# Patient Record
Sex: Male | Born: 1945 | Race: White | Hispanic: No | Smoking: Former smoker
Health system: Southern US, Community
[De-identification: ages and names within clinical notes are randomized; demographics above are authoritative.]

## PROBLEM LIST (undated history)

## (undated) DIAGNOSIS — I10 Essential (primary) hypertension: Secondary | ICD-10-CM

## (undated) DIAGNOSIS — E785 Hyperlipidemia, unspecified: Secondary | ICD-10-CM

## (undated) DIAGNOSIS — M353 Polymyalgia rheumatica: Secondary | ICD-10-CM

## (undated) DIAGNOSIS — M1710 Unilateral primary osteoarthritis, unspecified knee: Secondary | ICD-10-CM

## (undated) DIAGNOSIS — M25519 Pain in unspecified shoulder: Secondary | ICD-10-CM

## (undated) DIAGNOSIS — S2239XA Fracture of one rib, unspecified side, initial encounter for closed fracture: Secondary | ICD-10-CM

## (undated) DIAGNOSIS — M79609 Pain in unspecified limb: Secondary | ICD-10-CM

## (undated) DIAGNOSIS — M791 Myalgia, unspecified site: Secondary | ICD-10-CM

## (undated) DIAGNOSIS — M109 Gout, unspecified: Secondary | ICD-10-CM

## (undated) DIAGNOSIS — D72829 Elevated white blood cell count, unspecified: Secondary | ICD-10-CM

## (undated) DIAGNOSIS — M2550 Pain in unspecified joint: Secondary | ICD-10-CM

## (undated) DIAGNOSIS — K5730 Diverticulosis of large intestine without perforation or abscess without bleeding: Secondary | ICD-10-CM

## (undated) DIAGNOSIS — E119 Type 2 diabetes mellitus without complications: Secondary | ICD-10-CM

## (undated) DIAGNOSIS — M25539 Pain in unspecified wrist: Secondary | ICD-10-CM

## (undated) DIAGNOSIS — I2510 Atherosclerotic heart disease of native coronary artery without angina pectoris: Secondary | ICD-10-CM

## (undated) DIAGNOSIS — Z86010 Personal history of colonic polyps: Secondary | ICD-10-CM

## (undated) DIAGNOSIS — H269 Unspecified cataract: Secondary | ICD-10-CM

## (undated) DIAGNOSIS — F528 Other sexual dysfunction not due to a substance or known physiological condition: Secondary | ICD-10-CM

## (undated) DIAGNOSIS — K219 Gastro-esophageal reflux disease without esophagitis: Secondary | ICD-10-CM

## (undated) HISTORY — DX: Gastro-esophageal reflux disease without esophagitis: K21.9

## (undated) HISTORY — PX: OTHER SURGICAL HISTORY: SHX169

## (undated) HISTORY — DX: Pain in unspecified wrist: M25.539

## (undated) HISTORY — PX: COLONOSCOPY: SHX174

## (undated) HISTORY — DX: Hyperlipidemia, unspecified: E78.5

## (undated) HISTORY — PX: UPPER GASTROINTESTINAL ENDOSCOPY: SHX188

## (undated) HISTORY — DX: Pain in unspecified shoulder: M25.519

## (undated) HISTORY — DX: Personal history of colonic polyps: Z86.010

## (undated) HISTORY — DX: Other sexual dysfunction not due to a substance or known physiological condition: F52.8

## (undated) HISTORY — DX: Fracture of one rib, unspecified side, initial encounter for closed fracture: S22.39XA

## (undated) HISTORY — DX: Diverticulosis of large intestine without perforation or abscess without bleeding: K57.30

## (undated) HISTORY — PX: HYDROCELE EXCISION / REPAIR: SUR1145

## (undated) HISTORY — DX: Elevated white blood cell count, unspecified: D72.829

## (undated) HISTORY — DX: Gout, unspecified: M10.9

## (undated) HISTORY — DX: Unilateral primary osteoarthritis, unspecified knee: M17.10

## (undated) HISTORY — DX: Myalgia, unspecified site: M79.10

## (undated) HISTORY — DX: Unspecified cataract: H26.9

## (undated) HISTORY — PX: KNEE CARTILAGE SURGERY: SHX688

## (undated) HISTORY — DX: Pain in unspecified joint: M25.50

## (undated) HISTORY — PX: SHOULDER ARTHROSCOPY: SHX128

## (undated) HISTORY — DX: Pain in unspecified limb: M79.609

## (undated) HISTORY — PX: CATARACT EXTRACTION: SUR2

## (undated) HISTORY — DX: Polymyalgia rheumatica: M35.3

## (undated) HISTORY — DX: Atherosclerotic heart disease of native coronary artery without angina pectoris: I25.10

## (undated) HISTORY — DX: Type 2 diabetes mellitus without complications: E11.9

## (undated) HISTORY — DX: Essential (primary) hypertension: I10

---

## 2003-01-30 ENCOUNTER — Encounter: Admission: RE | Admit: 2003-01-30 | Discharge: 2003-04-30 | Payer: Self-pay | Admitting: Internal Medicine

## 2003-03-16 ENCOUNTER — Ambulatory Visit (HOSPITAL_COMMUNITY): Admission: RE | Admit: 2003-03-16 | Discharge: 2003-03-16 | Payer: Self-pay | Admitting: Internal Medicine

## 2003-03-16 ENCOUNTER — Encounter (INDEPENDENT_AMBULATORY_CARE_PROVIDER_SITE_OTHER): Payer: Self-pay | Admitting: Specialist

## 2004-05-08 ENCOUNTER — Encounter (INDEPENDENT_AMBULATORY_CARE_PROVIDER_SITE_OTHER): Payer: Self-pay | Admitting: Specialist

## 2004-05-08 ENCOUNTER — Ambulatory Visit (HOSPITAL_COMMUNITY): Admission: RE | Admit: 2004-05-08 | Discharge: 2004-05-08 | Payer: Self-pay | Admitting: General Surgery

## 2004-08-11 DIAGNOSIS — S2249XA Multiple fractures of ribs, unspecified side, initial encounter for closed fracture: Secondary | ICD-10-CM

## 2004-08-11 HISTORY — DX: Multiple fractures of ribs, unspecified side, initial encounter for closed fracture: S22.49XA

## 2005-01-09 ENCOUNTER — Ambulatory Visit: Payer: Self-pay | Admitting: Internal Medicine

## 2005-01-16 ENCOUNTER — Ambulatory Visit: Payer: Self-pay | Admitting: Internal Medicine

## 2006-06-03 ENCOUNTER — Ambulatory Visit: Payer: Self-pay | Admitting: Internal Medicine

## 2006-06-03 LAB — CONVERTED CEMR LAB
ALT: 36 units/L (ref 0–40)
AST: 35 units/L (ref 0–37)
Albumin: 3.9 g/dL (ref 3.5–5.2)
Alkaline Phosphatase: 68 units/L (ref 39–117)
BUN: 14 mg/dL (ref 6–23)
Basophils Absolute: 0 10*3/uL (ref 0.0–0.1)
Basophils Relative: 0.4 % (ref 0.0–1.0)
Bilirubin Urine: NEGATIVE
CO2: 28 meq/L (ref 19–32)
Calcium: 9.2 mg/dL (ref 8.4–10.5)
Chloride: 107 meq/L (ref 96–112)
Chol/HDL Ratio, serum: 5.3
Cholesterol: 198 mg/dL (ref 0–200)
Creatinine, Ser: 1 mg/dL (ref 0.4–1.5)
Eosinophil percent: 1.2 % (ref 0.0–5.0)
GFR calc non Af Amer: 81 mL/min
Glomerular Filtration Rate, Af Am: 98 mL/min/{1.73_m2}
Glucose, Bld: 117 mg/dL — ABNORMAL HIGH (ref 70–99)
HCT: 43.9 % (ref 39.0–52.0)
HDL: 37.7 mg/dL — ABNORMAL LOW (ref >39.0)
Hemoglobin, Urine: NEGATIVE
Hemoglobin: 14.7 g/dL (ref 13.0–17.0)
Hgb A1c MFr Bld: 5.8 % (ref 4.6–6.0)
Ketones, ur: NEGATIVE mg/dL
LDL Cholesterol: 135 mg/dL — ABNORMAL HIGH (ref 0–99)
Leukocytes, UA: NEGATIVE
Lymphocytes Relative: 32.9 % (ref 12.0–46.0)
MCHC: 33.6 g/dL (ref 30.0–36.0)
MCV: 91.9 fL (ref 78.0–100.0)
Monocytes Absolute: 0.4 10*3/uL (ref 0.2–0.7)
Monocytes Relative: 5.8 % (ref 3.0–11.0)
Neutro Abs: 4.5 10*3/uL (ref 1.4–7.7)
Neutrophils Relative %: 59.7 % (ref 43.0–77.0)
Nitrite: NEGATIVE
PSA: 1.35 ng/mL (ref 0.10–4.00)
Platelets: 228 10*3/uL (ref 150–400)
Potassium: 4.5 meq/L (ref 3.5–5.1)
RBC: 4.77 M/uL (ref 4.22–5.81)
RDW: 12.8 % (ref 11.5–14.6)
Sodium: 141 meq/L (ref 135–145)
Specific Gravity, Urine: 1.025 (ref 1.000–1.03)
TSH: 0.85 microintl units/mL (ref 0.35–5.50)
Total Bilirubin: 0.8 mg/dL (ref 0.3–1.2)
Total Protein, Urine: NEGATIVE mg/dL
Total Protein: 7.2 g/dL (ref 6.0–8.3)
Triglyceride fasting, serum: 129 mg/dL (ref 0–149)
Urine Glucose: NEGATIVE mg/dL
Urobilinogen, UA: 0.2 (ref 0.0–1.0)
VLDL: 26 mg/dL (ref 0–40)
WBC: 7.4 10*3/uL (ref 4.5–10.5)
pH: 5.5 (ref 5.0–8.0)

## 2006-06-10 ENCOUNTER — Ambulatory Visit: Payer: Self-pay | Admitting: Internal Medicine

## 2007-06-08 ENCOUNTER — Encounter: Payer: Self-pay | Admitting: *Deleted

## 2007-06-08 DIAGNOSIS — H269 Unspecified cataract: Secondary | ICD-10-CM | POA: Insufficient documentation

## 2007-06-08 HISTORY — DX: Unspecified cataract: H26.9

## 2007-06-23 ENCOUNTER — Ambulatory Visit: Payer: Self-pay | Admitting: Internal Medicine

## 2007-06-25 LAB — CONVERTED CEMR LAB
AST: 23 units/L (ref 0–37)
Alkaline Phosphatase: 68 units/L (ref 39–117)
Bilirubin, Direct: 0.2 mg/dL (ref 0.0–0.3)
CO2: 30 meq/L (ref 19–32)
Calcium: 9.2 mg/dL (ref 8.4–10.5)
Chloride: 105 meq/L (ref 96–112)
Creatinine, Ser: 0.8 mg/dL (ref 0.4–1.5)
Eosinophils Absolute: 0.1 10*3/uL (ref 0.0–0.6)
Eosinophils Relative: 0.8 % (ref 0.0–5.0)
Glucose, Bld: 116 mg/dL — ABNORMAL HIGH (ref 70–99)
HDL: 38.8 mg/dL — ABNORMAL LOW (ref >39.0)
Hemoglobin: 14.6 g/dL (ref 13.0–17.0)
LDL Cholesterol: 112 mg/dL — ABNORMAL HIGH (ref 0–99)
Leukocytes, UA: NEGATIVE
Lymphocytes Relative: 21.5 % (ref 12.0–46.0)
MCHC: 35.6 g/dL (ref 30.0–36.0)
MCV: 92.7 fL (ref 78.0–100.0)
Neutro Abs: 5.8 10*3/uL (ref 1.4–7.7)
Platelets: 247 10*3/uL (ref 150–400)
Potassium: 4.9 meq/L (ref 3.5–5.1)
RDW: 13.2 % (ref 11.5–14.6)
Sodium: 141 meq/L (ref 135–145)
Triglycerides: 170 mg/dL — ABNORMAL HIGH (ref 0–149)
Urobilinogen, UA: 0.2 (ref 0.0–1.0)

## 2007-06-30 ENCOUNTER — Ambulatory Visit: Payer: Self-pay | Admitting: Internal Medicine

## 2007-06-30 DIAGNOSIS — F528 Other sexual dysfunction not due to a substance or known physiological condition: Secondary | ICD-10-CM | POA: Insufficient documentation

## 2007-06-30 DIAGNOSIS — K219 Gastro-esophageal reflux disease without esophagitis: Secondary | ICD-10-CM | POA: Insufficient documentation

## 2007-06-30 DIAGNOSIS — E785 Hyperlipidemia, unspecified: Secondary | ICD-10-CM

## 2007-06-30 DIAGNOSIS — IMO0002 Reserved for concepts with insufficient information to code with codable children: Secondary | ICD-10-CM

## 2007-06-30 DIAGNOSIS — M1710 Unilateral primary osteoarthritis, unspecified knee: Secondary | ICD-10-CM

## 2007-06-30 HISTORY — DX: Hyperlipidemia, unspecified: E78.5

## 2007-06-30 HISTORY — DX: Gastro-esophageal reflux disease without esophagitis: K21.9

## 2007-06-30 HISTORY — DX: Reserved for concepts with insufficient information to code with codable children: IMO0002

## 2007-06-30 HISTORY — DX: Other sexual dysfunction not due to a substance or known physiological condition: F52.8

## 2008-08-09 ENCOUNTER — Ambulatory Visit: Payer: Self-pay | Admitting: Internal Medicine

## 2008-08-09 LAB — CONVERTED CEMR LAB
Albumin: 3.9 g/dL (ref 3.5–5.2)
Alkaline Phosphatase: 66 units/L (ref 39–117)
Basophils Absolute: 0.1 10*3/uL (ref 0.0–0.1)
Bilirubin, Direct: 0.1 mg/dL (ref 0.0–0.3)
Chloride: 102 meq/L (ref 96–112)
Cholesterol: 246 mg/dL (ref 0–200)
Eosinophils Absolute: 0.1 10*3/uL (ref 0.0–0.7)
Eosinophils Relative: 1.3 % (ref 0.0–5.0)
GFR calc Af Amer: 110 mL/min
HDL: 49.2 mg/dL (ref >39.0)
Hemoglobin, Urine: NEGATIVE
Leukocytes, UA: NEGATIVE
Monocytes Absolute: 0.5 10*3/uL (ref 0.1–1.0)
Neutro Abs: 5.3 10*3/uL (ref 1.4–7.7)
Neutrophils Relative %: 68.6 % (ref 43.0–77.0)
PSA: 1.98 ng/mL (ref 0.10–4.00)
RBC: 4.52 M/uL (ref 4.22–5.81)
Specific Gravity, Urine: 1.01 (ref 1.000–1.03)
Total CHOL/HDL Ratio: 5
Triglycerides: 276 mg/dL (ref 0–149)
Urine Glucose: NEGATIVE mg/dL
Urobilinogen, UA: 0.2 (ref 0.0–1.0)
VLDL: 55 mg/dL — ABNORMAL HIGH (ref 0–40)
pH: 6.5 (ref 5.0–8.0)

## 2008-08-17 ENCOUNTER — Ambulatory Visit: Payer: Self-pay | Admitting: Internal Medicine

## 2008-08-17 DIAGNOSIS — Z860100 Personal history of colon polyps, unspecified: Secondary | ICD-10-CM

## 2008-08-17 DIAGNOSIS — I10 Essential (primary) hypertension: Secondary | ICD-10-CM | POA: Insufficient documentation

## 2008-08-17 DIAGNOSIS — K5730 Diverticulosis of large intestine without perforation or abscess without bleeding: Secondary | ICD-10-CM

## 2008-08-17 HISTORY — DX: Essential (primary) hypertension: I10

## 2008-08-17 HISTORY — DX: Diverticulosis of large intestine without perforation or abscess without bleeding: K57.30

## 2008-08-17 HISTORY — DX: Personal history of colonic polyps: Z86.010

## 2008-08-17 HISTORY — DX: Personal history of colon polyps, unspecified: Z86.0100

## 2008-12-12 ENCOUNTER — Encounter (INDEPENDENT_AMBULATORY_CARE_PROVIDER_SITE_OTHER): Payer: Self-pay | Admitting: *Deleted

## 2009-02-07 ENCOUNTER — Ambulatory Visit: Payer: Self-pay | Admitting: Internal Medicine

## 2009-02-07 LAB — CONVERTED CEMR LAB
CO2: 28 meq/L (ref 19–32)
Calcium: 9.3 mg/dL (ref 8.4–10.5)
Cholesterol: 208 mg/dL — ABNORMAL HIGH (ref 0–200)
Creatinine, Ser: 0.9 mg/dL (ref 0.4–1.5)
Hgb A1c MFr Bld: 6.8 % — ABNORMAL HIGH (ref 4.6–6.5)
Potassium: 4.4 meq/L (ref 3.5–5.1)
Triglycerides: 355 mg/dL — ABNORMAL HIGH (ref 0.0–149.0)

## 2009-02-14 ENCOUNTER — Ambulatory Visit: Payer: Self-pay | Admitting: Internal Medicine

## 2009-10-03 ENCOUNTER — Ambulatory Visit: Payer: Self-pay | Admitting: Internal Medicine

## 2009-10-03 LAB — CONVERTED CEMR LAB
Alkaline Phosphatase: 73 units/L (ref 39–117)
Basophils Absolute: 0 10*3/uL (ref 0.0–0.1)
Basophils Relative: 0 % (ref 0.0–3.0)
Bilirubin, Direct: 0.1 mg/dL (ref 0.0–0.3)
CO2: 31 meq/L (ref 19–32)
Cholesterol: 210 mg/dL — ABNORMAL HIGH (ref 0–200)
Eosinophils Absolute: 0.2 10*3/uL (ref 0.0–0.7)
Eosinophils Relative: 2.5 % (ref 0.0–5.0)
HDL: 50.3 mg/dL (ref >39.00)
Hemoglobin: 14.1 g/dL (ref 13.0–17.0)
Hgb A1c MFr Bld: 7.5 % — ABNORMAL HIGH (ref 4.6–6.5)
Ketones, ur: NEGATIVE mg/dL
Lymphs Abs: 1.6 10*3/uL (ref 0.7–4.0)
MCV: 93.4 fL (ref 78.0–100.0)
Monocytes Absolute: 0.4 10*3/uL (ref 0.1–1.0)
Monocytes Relative: 6.4 % (ref 3.0–12.0)
Neutro Abs: 4.2 10*3/uL (ref 1.4–7.7)
PSA: 2.52 ng/mL (ref 0.10–4.00)
Specific Gravity, Urine: 1.015 (ref 1.000–1.030)
TSH: 1.11 microintl units/mL (ref 0.35–5.50)
Total CHOL/HDL Ratio: 4
Total Protein: 7.1 g/dL (ref 6.0–8.3)
pH: 6 (ref 5.0–8.0)

## 2009-10-10 ENCOUNTER — Ambulatory Visit: Payer: Self-pay | Admitting: Internal Medicine

## 2009-10-10 DIAGNOSIS — E119 Type 2 diabetes mellitus without complications: Secondary | ICD-10-CM | POA: Insufficient documentation

## 2009-10-10 HISTORY — DX: Type 2 diabetes mellitus without complications: E11.9

## 2010-04-22 ENCOUNTER — Ambulatory Visit: Payer: Self-pay | Admitting: Internal Medicine

## 2010-04-22 DIAGNOSIS — M79609 Pain in unspecified limb: Secondary | ICD-10-CM

## 2010-04-22 HISTORY — DX: Pain in unspecified limb: M79.609

## 2010-07-09 ENCOUNTER — Ambulatory Visit: Payer: Self-pay | Admitting: Internal Medicine

## 2010-07-09 DIAGNOSIS — M25539 Pain in unspecified wrist: Secondary | ICD-10-CM

## 2010-07-09 DIAGNOSIS — M109 Gout, unspecified: Secondary | ICD-10-CM | POA: Insufficient documentation

## 2010-07-09 DIAGNOSIS — M25519 Pain in unspecified shoulder: Secondary | ICD-10-CM

## 2010-07-09 HISTORY — DX: Pain in unspecified shoulder: M25.519

## 2010-07-09 HISTORY — DX: Pain in unspecified wrist: M25.539

## 2010-07-09 HISTORY — DX: Gout, unspecified: M10.9

## 2010-09-10 NOTE — Assessment & Plan Note (Signed)
Summary: WRIST PAIN/CD   Vital Signs:  Patient profile:   65 year old male Height:      66 inches Weight:      194.50 pounds BMI:     31.51 O2 Sat:      95 % on Room air Temp:     98.3 degrees F oral Pulse rate:   79 / minute BP sitting:   142 / 82  (left arm) Cuff size:   regular  Vitals Entered By: Zella Ball Ewing CMA Duncan Dull) (April 22, 2010 2:23 PM)  O2 Flow:  Room air  CC: Right wrist pain/RE   CC:  Right wrist pain/RE.  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  Trying to follow lower chol diet, but not always able to control his diet as he would like.    Problems Prior to Update: 1)  Hand Pain, Right  (ICD-729.5) 2)  Diabetes Mellitus, Type II  (ICD-250.00) 3)  Preventive Health Care  (ICD-V70.0) 4)  Colonic Polyps, Hx of  (ICD-V12.72) 5)  Diverticulosis, Colon  (ICD-562.10) 6)  Hypertension  (ICD-401.9) 7)  Family History Diabetes 1st Degree Relative  (ICD-V18.0) 8)  Gerd  (ICD-530.81) 9)  Osteoarthritis, Knee, Left  (ICD-715.96) 10)  Erectile Dysfunction  (ICD-302.72) 11)  Hyperlipidemia  (ICD-272.4) 12)  Preventive Health Care  (ICD-V70.0) 13)  Special Screening Malignant Neoplasm of Prostate  (ICD-V76.44) 14)  Routine General Medical Exam@health  Care Facl  (ICD-V70.0) 15)  Cataract Nos   (ICD-366.9)  Medications Prior to Update: 1)  Omeprazole 20 Mg  Cpdr (Omeprazole) .Marland Kitchen.. 1 By Mouth Once Daily 2)  Cialis 20 Mg  Tabs (Tadalafil) .Marland Kitchen.. 1 By Mouth Once Daily Prn 3)  Adult Aspirin Ec Low Strength 81 Mg Tbec (Aspirin) .Marland Kitchen.. 1po Once Daily 4)  Metformin Hcl 500 Mg Xr24h-Tab (Metformin Hcl) .Marland Kitchen.. 1po Once Daily  Current Medications (verified): 1)  Omeprazole 20 Mg  Cpdr (Omeprazole) .Marland Kitchen.. 1 By Mouth Once Daily 2)  Cialis 20 Mg  Tabs (Tadalafil) .Marland Kitchen.. 1 By Mouth Once Daily Prn 3)  Adult Aspirin Ec Low Strength 81 Mg Tbec (Aspirin) .Marland Kitchen.. 1po Once Daily 4)  Metformin Hcl 500 Mg Xr24h-Tab (Metformin Hcl) .Marland Kitchen.. 1po Once Daily 5)  Naproxen 500 Mg Tabs (Naproxen) .Marland Kitchen.. 1po Two Times A Day As Needed  Allergies (verified): No Known Drug Allergies  Past History:  Past Medical History: Last updated: 10/10/2009 Hyperlipidemia E.D. 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   Head:  normocephalic and atraumatic.   Eyes:  vision grossly intact, pupils equal, and pupils round.   Ears:  R ear normal and L ear normal.   Nose:  no external deformity and no nasal discharge.   Mouth:  no gingival abnormalities and pharynx pink and moist.   Neck:  supple and no masses.   Lungs:  normal respiratory effort and normal breath sounds.   Heart:  normal rate and regular rhythm.   Msk:  has multiple bony joint changes to most fingers of both  hands , right more than left, , without active synovitis Extremities:  no edema, no erythema  Neurologic:  cranial nerves II-XII intact, strength normal in all extremities, sensation intact to light touch, and DTRs symmetrical and normal.  including normal bilat grip strength   Impression & Recommendations:  Problem # 1:  HAND PAIN, RIGHT (ICD-729.5)  exam c/w  djd fingers and wrist;  and ? CTS as well - for naproxen as needed, and right wrist splint at night only  Orders: Ankle / Wrist Splint (A4570)  Problem # 2:  HYPERTENSION (ICD-401.9)  BP today: 142/82 Prior BP: 130/84 (10/10/2009)  Labs Reviewed: K+: 4.5 (10/03/2009) Creat: : 0.8 (10/03/2009)   Chol: 210 (10/03/2009)   HDL: 50.30 (10/03/2009)   LDL: DEL (08/09/2008)   TG: 295.0 (10/03/2009) mild elev today, likely situational, ok to follow, continue same treatment   Problem # 3:  HYPERLIPIDEMIA (ICD-272.4)  Labs Reviewed: SGOT: 33 (10/03/2009)   SGPT: 43 (10/03/2009)   HDL:50.30 (10/03/2009), 42.00 (02/07/2009)  LDL:DEL (08/09/2008), 112 (06/23/2007)  Chol:210 (10/03/2009), 208 (02/07/2009)  Trig:295.0 (10/03/2009), 355.0 (02/07/2009) d/w pt - cont to follow diet, no other tx at this time  Complete Medication List: 1)  Omeprazole 20 Mg Cpdr (Omeprazole) .Marland Kitchen.. 1 by mouth once daily 2)  Cialis 20 Mg Tabs (Tadalafil) .Marland Kitchen.. 1 by mouth once daily prn 3)  Adult Aspirin Ec Low Strength 81 Mg Tbec (Aspirin) .Marland Kitchen.. 1po once daily 4)  Metformin Hcl 500 Mg Xr24h-tab (Metformin hcl) .Marland Kitchen.. 1po once daily 5)  Naproxen 500 Mg Tabs (Naproxen) .Marland Kitchen.. 1po two times a day as needed  Patient Instructions: 1)  Please take all new medications as prescribed 2)  Continue all previous medications as before this visit  3)  you are given the right wrist splint to wear at night only 4)  followup with any worsening symptoms 5)  Please schedule a follow-up appointment in Feb 2012 with CPX labs and; 6)  HbgA1C prior to visit, ICD-9:  250.02 Prescriptions: NAPROXEN 500 MG TABS (NAPROXEN) 1po two times a day as needed  #60 x 5   Entered and Authorized by:   Corwin Levins MD   Signed by:   Corwin Levins MD on 04/22/2010   Method used:   Electronically to        [facility] [phone]* (retail)       [address] [place], [place]  [address]       Ph: [phone]       Fax: [phone]   RxID:   [id number]

## 2010-09-10 NOTE — Assessment & Plan Note (Signed)
Summary: CPX / NWS #   Vital Signs:  Patient profile:   65 year old male Height:      66 inches Weight:      196.25 pounds BMI:     31.79 O2 Sat:      96 % on Room air Temp:     98.1 degrees F oral Pulse rate:   80 / minute BP sitting:   130 / 84  (left arm) Cuff size:   large  Vitals Entered ByZella Ball Ewing (October 10, 2009 1:17 PM)  O2 Flow:  Room air  CC: Adult Physical/RE   CC:  Adult Physical/RE.  History of Present Illness: gained 4 lbs since last yr, has been trying to avoid splenda and diet colas with more regular sodas and real sugar in the coffee, no wt low;  still cannot afford the copay for the colonscopy f/u and the logistics - does not have much help at this point; Pt denies CP, sob, doe, wheezing, orthopnea, pnd, worsening LE edema, palps, dizziness or syncope  Pt denies new neuro symptoms such as headache, facial or extremity weakness Pt denies polydipsia, polyuria, or low sugar symptoms such as shakiness improved with eating.  Overall good compliance with meds, trying to follow low chol, DM diet, wt stable, little excercise however   Problems Prior to Update: 1)  Diabetes Mellitus, Type II  (ICD-250.00) 2)  Preventive Health Care  (ICD-V70.0) 3)  Colonic Polyps, Hx of  (ICD-V12.72) 4)  Diverticulosis, Colon  (ICD-562.10) 5)  Hypertension  (ICD-401.9) 6)  Family History Diabetes 1st Degree Relative  (ICD-V18.0) 7)  Gerd  (ICD-530.81) 8)  Osteoarthritis, Knee, Left  (ICD-715.96) 9)  Erectile Dysfunction  (ICD-302.72) 10)  Hyperlipidemia  (ICD-272.4) 11)  Preventive Health Care  (ICD-V70.0) 12)  Special Screening Malignant Neoplasm of Prostate  (ICD-V76.44) 13)  Routine General Medical Exam@health  Care Facl  (ICD-V70.0) 14)  Cataract Nos  (ICD-366.9)  Medications Prior to Update: 1)  Omeprazole 20 Mg  Cpdr (Omeprazole) .Marland Kitchen.. 1 By Mouth Once Daily 2)  Meloxicam 15 Mg  Tabs (Meloxicam) .Marland Kitchen.. 1 By Mouth  Once Daily As Needed 3)  Cialis 20 Mg  Tabs (Tadalafil)  .Marland Kitchen.. 1 By Mouth Once Daily Prn 4)  Adult Aspirin Ec Low Strength 81 Mg Tbec (Aspirin) .Marland Kitchen.. 1po Once Daily 5)  Cozaar 50 Mg Tabs (Losartan Potassium) .... Take 1 Tab By Mouth Every Day  Current Medications (verified): 1)  Omeprazole 20 Mg  Cpdr (Omeprazole) .Marland Kitchen.. 1 By Mouth Once Daily 2)  Cialis 20 Mg  Tabs (Tadalafil) .Marland Kitchen.. 1 By Mouth Once Daily Prn 3)  Adult Aspirin Ec Low Strength 81 Mg Tbec (Aspirin) .Marland Kitchen.. 1po Once Daily 4)  Metformin Hcl 500 Mg Xr24h-Tab (Metformin Hcl) .Marland Kitchen.. 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 left knee djd GERD Hypertension bilateral cataracts Diverticulosis, colon Colonic polyps, hx of esophageal stricture Diabetes mellitus, type II  Past Surgical History: Reviewed history from 08/17/2008 and no changes required. Cataract extraction Inguinal herniorrhaphy s/p right knee arthroscopic surgury in his 25's  Family History: Reviewed history from 08/17/2008 and no changes required. Family History Diabetes 1st degree relative Dementia  in both parents aunt with parkinsons  Social History: Reviewed history from 08/17/2008 and no changes required. 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         all otherwise negative per pt -   Physical Exam  General:  alert and overweight-appearing.   Head:  normocephalic and atraumatic.   Eyes:  vision grossly intact, pupils equal, and pupils round.   Ears:  R ear normal and L ear normal.   Nose:  no external deformity and no nasal discharge.   Mouth:  no gingival abnormalities and pharynx pink and moist.   Neck:  supple and no masses.   Lungs:  normal respiratory effort and normal breath sounds.   Heart:  normal rate and regular rhythm.   Abdomen:  soft, non-tender, and normal bowel sounds.   Msk:  no joint tenderness and no joint swelling.   Extremities:  no edema, no erythema  Neurologic:  cranial nerves II-XII intact and strength normal in all extremities.     Impression & Recommendations:  Problem # 1:  Preventive Health Care (ICD-V70.0)  Overall doing well, age appropriate education and counseling updated and referral for appropriate preventive services done unless declined, immunizations up to date or declined, diet counseling done if overweight, urged to quit smoking if smokes , most recent labs reviewed and current ordered if appropriate, ecg reviewed or declined (interpretation per ECG scanned in the EMR if done); information regarding Medicare Prevention requirements given if appropriate   Orders: EKG w/ Interpretation (93000)  Problem # 2:  DIABETES MELLITUS, TYPE II (ICD-250.00)  The following medications were removed from the medication list:    Cozaar 50 Mg Tabs (Losartan potassium) .Marland Kitchen... Take 1 tab by mouth every day His updated medication list for this problem includes:    Adult Aspirin Ec Low Strength 81 Mg Tbec (Aspirin) .Marland Kitchen... 1po once daily    Metformin Hcl 500 Mg Xr24h-tab (Metformin hcl) .Marland Kitchen... 1po once daily new onset - to start metformin asd, f/u  next visit, urged diet complaicne (has had DM diet education)   Labs Reviewed: Creat: 0.8 (10/03/2009)    Reviewed HgBA1c results: 7.5 (10/03/2009)  6.8 (02/07/2009)  Problem # 3:  HYPERTENSION (ICD-401.9)  The following medications were removed from the medication list:    Cozaar 50 Mg Tabs (Losartan potassium) .Marland Kitchen... Take 1 tab by mouth every day declines meds for now; improved with low salt diet  BP today: 130/84 Prior BP: 148/102 (02/14/2009)  Labs Reviewed: K+: 4.5 (10/03/2009) Creat: : 0.8 (10/03/2009)   Chol: 210 (10/03/2009)   HDL: 50.30 (10/03/2009)   LDL: DEL (08/09/2008)   TG: 295.0 (10/03/2009)  Problem # 4:  HYPERLIPIDEMIA (ICD-272.4)  Labs Reviewed: SGOT: 33 (10/03/2009)   SGPT: 43 (10/03/2009)   HDL:50.30 (10/03/2009), 42.00 (02/07/2009)  LDL:DEL (08/09/2008), 112 (06/23/2007)  Chol:210 (10/03/2009), 208 (02/07/2009)  Trig:295.0 (10/03/2009), 355.0 (02/07/2009)  d/w pt - goal ldl less than 70 - declines statin at this time  Complete Medication List: 1)  Omeprazole 20 Mg Cpdr (Omeprazole) .Marland Kitchen.. 1 by mouth once daily 2)  Cialis 20 Mg Tabs (Tadalafil) .Marland Kitchen.. 1 by mouth  once daily prn 3)  Adult Aspirin Ec Low Strength 81 Mg Tbec (Aspirin) .Marland Kitchen.. 1po once daily 4)  Metformin Hcl 500 Mg Xr24h-tab (Metformin hcl) .Marland Kitchen.. 1po once daily  Patient Instructions: 1)  start the metformin 500 mg per day 2)  Continue all previous medications as before this visit  3)  All of your medications were sent to the pharmacy 4)  Take an Aspirin every day - 81 mg - 1 per day - COATED only 5)  Please schedule a follow-up appointment in 6 months with : 6)  BMP prior to visit, ICD-9: 250.02 7)  Lipid Panel prior to visit, ICD-9: 8)  HbgA1C prior to visit, ICD-9: 9)  Please call if you wish to be scheduled for the colonoscopy Prescriptions: OMEPRAZOLE 20 MG  CPDR (OMEPRAZOLE) 1 by mouth once daily  #90 x 3   Entered and Authorized by:   Corwin Levins MD   Signed by:   Corwin Levins MD on  10/10/2009   Method used:   Electronically to        [facility]  [address] [phone]* (retail)       [address]       [place], [place]  [address]       Ph: [phone] or [phone]       Fax: [phone]   RxID:   [id number] METFORMIN HCL 500 MG XR24H-TAB (METFORMIN HCL) 1po once daily  #90 x 3   Entered and Authorized by:   Corwin Levins MD   Signed by:   Corwin Levins MD on 10/10/2009   Method used:   Electronically to        [facility]  [address] [phone]* (retail)       [address]       [place], [place]  [address]       Ph: [phone] or [phone]       Fax: [phone]   RxID:   [id number]    Immunization History:  Influenza Immunization History:    Influenza:  historical (05/11/2009)    Immunization History:  Influenza Immunization History:    Influenza:  Historical (05/11/2009)

## 2010-09-10 NOTE — Assessment & Plan Note (Signed)
Summary: both hands,shoulders painful/?cd   Vital Signs:  Patient profile:   65 year old male Height:      66 inches Weight:      184.25 pounds BMI:     29.85 O2 Sat:      97 % on Room air Temp:     97.8 degrees F oral Pulse rate:   75 / minute BP sitting:   128 / 80  (left arm) Cuff size:   regular  Vitals Entered By: Zella Ball Ewing CMA Duncan Dull) (July 09, 2010 8:48 AM)  O2 Flow:  Room air CC: Shoulder and hand pain/RE   CC:  Shoulder and hand pain/RE.  History of Present Illness: here to f/u - was here last visit sept with ? tendonitis/cts/DJD to distal RUE , but now with visible and worsening swelling and pain to the right wrist and hand despite better diet with less red meat and no ETOH for 3 mo ;  alleve definitley seems to help the pain, better at night so at least he can get some sleep;  no trauma , injury, fever or hx of known gout; no known elev uric acid in the past.   No other joint problem except for pain to the right shoulder as well (no pain from shoulder to the wrist though - separate problem.  Also some vagie pains to the left hand having to compensate for decreased use of the right.  Bilat shoulder pain is ongoing for 4 to 6 wks, worse to abduct, again wtih without injruy, trauma, fever.  Does go to the gym doing the elliptical and treadmill only.  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   Pt denies CP, worsening sob, doe, wheezing, orthopnea, pnd, worsening LE edema, palps, dizziness or syncope  Pt denies other new neuro symptoms such as headache, facial or extremity weakness Pt denies polydipsia, polyuria, or low sugar symptoms such as shakiness improved with eating.  Overall good compliance with meds, trying to follow low chol, DM diet, wt stable, little excercise however   Preventive Screening-Counseling & Management      Drug Use:  no.    Problems Prior to Update: 1)  Gout   (ICD-274.9) 2)  Wrist Pain, Right  (ICD-719.43) 3)  Hand Pain, Right  (ICD-729.5) 4)  Diabetes Mellitus, Type II  (ICD-250.00) 5)  Preventive Health Care  (ICD-V70.0) 6)  Colonic Polyps, Hx of  (ICD-V12.72) 7)  Diverticulosis, Colon  (ICD-562.10) 8)  Hypertension  (ICD-401.9) 9)  Family History Diabetes 1st Degree Relative  (ICD-V18.0) 10)  Gerd  (ICD-530.81) 11)  Osteoarthritis, Knee, Left  (ICD-715.96) 12)  Erectile Dysfunction  (ICD-302.72) 13)  Hyperlipidemia  (ICD-272.4) 14)  Preventive Health Care  (ICD-V70.0) 15)  Special Screening Malignant Neoplasm of Prostate  (ICD-V76.44) 16)  Routine General Medical Exam@health  Care [facility]  (ICD-V70.0) 17)  Cataract Nos  (ICD-366.9)  Medications Prior to Update: 1)  Omeprazole 20 Mg  Cpdr (Omeprazole) .Marland Kitchen.. 1 By Mouth Once Daily 2)  Cialis 20 Mg  Tabs (Tadalafil) .Marland Kitchen.. 1 By Mouth Once Daily Prn 3)  Adult Aspirin Ec Low Strength 81 Mg Tbec (Aspirin) .Marland Kitchen.. 1po Once Daily 4)  Metformin Hcl 500 Mg Xr24h-Tab (Metformin Hcl) .Marland Kitchen.. 1po Once Daily 5)  Naproxen 500 Mg Tabs (Naproxen) .Marland Kitchen.. 1po Two Times A Day As Needed  Current Medications (verified): 1)  Omeprazole 20 Mg  Cpdr (Omeprazole) .Marland KitchenMarland KitchenMarland Kitchen  1 By Mouth Once Daily 2)  Cialis 20 Mg  Tabs (Tadalafil) .Marland Kitchen.. 1 By Mouth Once Daily Prn 3)  Adult Aspirin Ec Low Strength 81 Mg Tbec (Aspirin) .Marland Kitchen.. 1po Once Daily 4)  Metformin Hcl 500 Mg Xr24h-Tab (Metformin Hcl) .Marland Kitchen.. 1po Once Daily 5)  Naproxen 500 Mg Tabs (Naproxen) .Marland Kitchen.. 1po Two Times A Day As Needed 6)  Prednisone 10 Mg Tabs (Prednisone) .... 4po Qd For 3days, Then 3po Qd For 3days, Then 2po Qd For 3days, Then 1po Qd For 3 Days, Then Stop 7)  Indomethacin 50 Mg Caps (Indomethacin) .Marland Kitchen.. 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 left knee djd GERD Hypertension bilateral cataracts Diverticulosis, colon Colonic polyps, hx of esophageal stricture Diabetes mellitus, type II Gout  Family History: Reviewed history from 08/17/2008 and no changes required. 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   Head:  normocephalic and atraumatic.   Eyes:  vision grossly intact, pupils equal, and pupils round.   Ears:  R ear normal and L ear normal.   Nose:  no external deformity and no nasal discharge.   Mouth:  no gingival abnormalities and pharynx pink and moist.   Neck:  supple and no masses.   Lungs:  normal respiratory effort and normal breath sounds.   Heart:  normal rate and regular rhythm.   Msk:  has multiple bony joint changes to most fingers of both hands , right more than left, , with active synovitis of right wrist 1+ with decreasedROM,  also pain to abduciton bilat shoulders though NT and FROM Extremities:  no edema, no erythema  Neurologic:  strength normal in all extremities and sensation intact to light touch.     Impression & Recommendations:  Problem # 1:  WRIST PAIN, RIGHT (ICD-719.43) most likely c/w gout vs other tenosynovitis - for depomedrol today, predpack for home, indocin as needed future attacks, check uric acid, consider allopurinol Orders: TLB-Uric Acid, Blood (84550-URIC)  Problem # 2:  SHOULDER PAIN, BILATERAL  (ICD-719.41)  His updated medication list for this problem includes:    Adult Aspirin Ec Low Strength 81 Mg Tbec (Aspirin) .Marland Kitchen... 1po once daily    Naproxen 500 Mg Tabs (Naproxen) .Marland Kitchen... 1po two times a day as needed    Indomethacin 50 Mg Caps (Indomethacin) .Marland Kitchen... 1po three times a day as needed mild, most likely c/w tendonitis, consider ortho eval if worse or persists  Problem # 3:  HYPERTENSION (ICD-401.9)  BP today: 128/80 Prior BP: 142/82 (04/22/2010)  Labs Reviewed: K+: 4.5 (10/03/2009) Creat: : 0.8 (10/03/2009)   Chol: 210 (10/03/2009)   HDL: 50.30 (10/03/2009)   LDL: DEL (08/09/2008)   TG: 295.0 (10/03/2009) stable overall by hx and exam, ok to continue meds/tx as is - no meds  needed at this time  Problem # 4:  DIABETES MELLITUS, TYPE II (ICD-250.00)  His updated medication list for this problem includes:    Adult Aspirin Ec Low Strength 81 Mg Tbec (Aspirin) .Marland Kitchen... 1po once daily    Metformin Hcl 500 Mg Xr24h-tab (Metformin hcl) .Marland Kitchen... 1po once daily  Labs Reviewed: Creat: 0.8 (10/03/2009)    Reviewed HgBA1c results: 7.5 (10/03/2009)  6.8 (02/07/2009) stable overall by hx and exam, ok to continue meds/tx as is  - pt to call for cbg > 200 on predpack  Complete Medication List: 1)  Omeprazole 20 Mg Cpdr (Omeprazole) .Marland Kitchen.. 1 by mouth once daily 2)  Cialis 20 Mg Tabs (Tadalafil) .Marland Kitchen.. 1 by mouth once daily prn 3)  Adult Aspirin Ec Low Strength 81 Mg Tbec (Aspirin) .Marland Kitchen.. 1po once daily 4)  Metformin Hcl 500 Mg Xr24h-tab (Metformin hcl) .Marland Kitchen.. 1po once daily 5)  Naproxen 500 Mg Tabs (Naproxen) .Marland Kitchen.. 1po two times a day as needed 6)  Prednisone 10 Mg Tabs (Prednisone) .... 4po qd for 3days, then 3po qd for 3days, then 2po qd for 3days, then 1po qd for 3 days, then stop 7)  Indomethacin 50 Mg Caps (Indomethacin) .Marland Kitchen.. 1po three times a day as needed  Other Orders: Depo- Medrol 40mg  (J1030) Depo- Medrol 80mg  (J1040) Admin of Therapeutic Inj  intramuscular or subcutaneous  (16109)  Patient Instructions: 1)  you had the steroid shot today 2)  Please take all new medications as prescribed  - the prednisone for home to start now 3)  You are also given the Indomethacin (anti-inflammatory) for FUTURE attacks only 4)  Please go to the Lab in the basement for your blood and/or urine tests today  - the uric acid level 5)  IF the uric acid is elevated, in your case you may want to take Allopurinol to prevent future attacks (a daily medication) 6)  The prednisone may help your shoulders as well (most likely tendonitis of the shoulders), but if this recurs and worse, you may want to see orthopedic 7)  Continue all other previous medications as before this visit  8)  remember your sugars will elevate mildly on the steroid tx, this will return to usual levels after the steroid treatment is done 9)  Please schedule a follow-up appointment in 3 months with CPX and labs and: 10)  uric acid: 274.9 Prescriptions: INDOMETHACIN 50 MG CAPS (INDOMETHACIN) 1po three times a day as needed  #90 x 3   Entered and Authorized by:   Corwin Levins MD   Signed by:   Corwin Levins MD on 07/09/2010   Method used:   Print then Give to Patient   RxID:   [id number] PREDNISONE 10 MG TABS (PREDNISONE) 4po qd for 3days, then 3po qd for 3days, then 2po qd for 3days, then 1po qd for 3 days, then stop  #30 x 0   Entered and Authorized by:   Corwin Levins MD   Signed by:   Corwin Levins MD on 07/09/2010   Method used:   Print then Give to Patient   RxID:   [id number]    Medication Administration  Injection # 1:    Medication: Depo- Medrol 40mg     Diagnosis: GOUT (ICD-274.9)    Route: IM    Site: LUOQ gluteus    Exp Date: 01/2013    Lot #: [id number]    Mfr: [employer]    Comments: Patient received 120mg  Depo-medrol    Patient tolerated injection  without complications    Given by: Zella Ball Ewing CMA Duncan Dull) (July 09, 2010 10:59 AM)  Injection # 2:    Medication: Depo- Medrol 80mg      Diagnosis: GOUT (ICD-274.9)    Route: IM    Site: LUOQ gluteus    Exp Date: 01/2013    Lot #: [id number]    Mfr: [employer]    Given by: Zella Ball Ewing CMA Duncan Dull) (July 09, 2010 10:59 AM)  Orders Added: 1)  Depo- Medrol 40mg  [J1030] 2)  Depo- Medrol 80mg  [J1040] 3)  Admin of Therapeutic Inj  intramuscular or subcutaneous [96372] 4)  TLB-Uric Acid, Blood [84550-URIC] 5)  Est. Patient Level IV [16109]

## 2010-09-16 ENCOUNTER — Telehealth: Payer: Self-pay | Admitting: Internal Medicine

## 2010-09-26 NOTE — Progress Notes (Signed)
Summary: referral  Phone Note Call from Patient Call back at Home Phone [phone]   Caller: Patient Summary of Call: Pt called requesting referral to Ortho for continued shoulder pain. Initial call taken by: Margaret Pyle, CMA,  September 16, 2010 4:07 PM  Follow-up for Phone Call        ok - will do  Follow-up by: Corwin Levins MD,  September 16, 2010 4:48 PM  Additional Follow-up for Phone Call Additional follow up Details #1::        Pt advised and will expect a call from [facility] with appt info Additional Follow-up by: Margaret Pyle, CMA,  September 17, 2010 8:05 AM

## 2010-10-10 ENCOUNTER — Other Ambulatory Visit: Payer: BC Managed Care – PPO

## 2010-10-10 ENCOUNTER — Encounter (INDEPENDENT_AMBULATORY_CARE_PROVIDER_SITE_OTHER): Payer: Self-pay | Admitting: *Deleted

## 2010-10-10 ENCOUNTER — Other Ambulatory Visit: Payer: Self-pay | Admitting: Internal Medicine

## 2010-10-10 DIAGNOSIS — M109 Gout, unspecified: Secondary | ICD-10-CM

## 2010-10-10 DIAGNOSIS — Z0000 Encounter for general adult medical examination without abnormal findings: Secondary | ICD-10-CM

## 2010-10-10 DIAGNOSIS — E119 Type 2 diabetes mellitus without complications: Secondary | ICD-10-CM

## 2010-10-10 LAB — URINALYSIS
Ketones, ur: NEGATIVE
Leukocytes, UA: NEGATIVE
Nitrite: NEGATIVE
Total Protein, Urine: NEGATIVE
Urine Glucose: NEGATIVE

## 2010-10-10 LAB — CBC WITH DIFFERENTIAL/PLATELET
Basophils Absolute: 0.1 10*3/uL (ref 0.0–0.1)
Basophils Relative: 0.4 % (ref 0.0–3.0)
Eosinophils Absolute: 0.1 10*3/uL (ref 0.0–0.7)
HCT: 36.5 % — ABNORMAL LOW (ref 39.0–52.0)
Hemoglobin: 12.4 g/dL — ABNORMAL LOW (ref 13.0–17.0)
Monocytes Absolute: 1.1 10*3/uL — ABNORMAL HIGH (ref 0.1–1.0)
Monocytes Relative: 6.4 % (ref 3.0–12.0)
Neutro Abs: 12.6 10*3/uL — ABNORMAL HIGH (ref 1.4–7.7)
Neutrophils Relative %: 75.3 % (ref 43.0–77.0)
RBC: 4.22 Mil/uL (ref 4.22–5.81)

## 2010-10-10 LAB — BASIC METABOLIC PANEL
CO2: 26 mEq/L (ref 19–32)
Calcium: 9 mg/dL (ref 8.4–10.5)
Creatinine, Ser: 0.6 mg/dL (ref 0.4–1.5)
GFR: 138.46 mL/min (ref >60.00)
Glucose, Bld: 147 mg/dL — ABNORMAL HIGH (ref 70–99)

## 2010-10-10 LAB — LIPID PANEL
Cholesterol: 188 mg/dL (ref 0–200)
LDL Cholesterol: 109 mg/dL — ABNORMAL HIGH (ref 0–99)
Total CHOL/HDL Ratio: 3

## 2010-10-10 LAB — HEPATIC FUNCTION PANEL
AST: 19 U/L (ref 0–37)
Albumin: 3.6 g/dL (ref 3.5–5.2)
Alkaline Phosphatase: 93 U/L (ref 39–117)
Bilirubin, Direct: 0.1 mg/dL (ref 0.0–0.3)
Total Protein: 6.9 g/dL (ref 6.0–8.3)

## 2010-10-10 LAB — URIC ACID: Uric Acid, Serum: 6 mg/dL (ref 4.0–7.8)

## 2010-10-10 LAB — HEMOGLOBIN A1C: Hgb A1c MFr Bld: 8.4 % — ABNORMAL HIGH (ref 4.6–6.5)

## 2010-10-16 ENCOUNTER — Encounter: Payer: Self-pay | Admitting: Internal Medicine

## 2010-10-16 ENCOUNTER — Encounter (INDEPENDENT_AMBULATORY_CARE_PROVIDER_SITE_OTHER): Payer: BC Managed Care – PPO | Admitting: Internal Medicine

## 2010-10-16 ENCOUNTER — Other Ambulatory Visit: Payer: Self-pay | Admitting: Internal Medicine

## 2010-10-16 ENCOUNTER — Other Ambulatory Visit: Payer: BC Managed Care – PPO

## 2010-10-16 DIAGNOSIS — M2550 Pain in unspecified joint: Secondary | ICD-10-CM

## 2010-10-16 DIAGNOSIS — D72829 Elevated white blood cell count, unspecified: Secondary | ICD-10-CM

## 2010-10-16 DIAGNOSIS — E785 Hyperlipidemia, unspecified: Secondary | ICD-10-CM

## 2010-10-16 DIAGNOSIS — M25519 Pain in unspecified shoulder: Secondary | ICD-10-CM

## 2010-10-16 DIAGNOSIS — M79609 Pain in unspecified limb: Secondary | ICD-10-CM

## 2010-10-16 DIAGNOSIS — E119 Type 2 diabetes mellitus without complications: Secondary | ICD-10-CM

## 2010-10-16 DIAGNOSIS — Z0000 Encounter for general adult medical examination without abnormal findings: Secondary | ICD-10-CM

## 2010-10-16 HISTORY — DX: Elevated white blood cell count, unspecified: D72.829

## 2010-10-16 HISTORY — DX: Pain in unspecified joint: M25.50

## 2010-10-16 LAB — CBC WITH DIFFERENTIAL/PLATELET
Basophils Absolute: 0 10*3/uL (ref 0.0–0.1)
HCT: 36.3 % — ABNORMAL LOW (ref 39.0–52.0)
Hemoglobin: 12.5 g/dL — ABNORMAL LOW (ref 13.0–17.0)
Lymphs Abs: 2.6 10*3/uL (ref 0.7–4.0)
MCHC: 34.4 g/dL (ref 30.0–36.0)
MCV: 85.8 fl (ref 78.0–100.0)
Monocytes Absolute: 0.7 10*3/uL (ref 0.1–1.0)
Neutro Abs: 10.7 10*3/uL — ABNORMAL HIGH (ref 1.4–7.7)
Neutrophils Relative %: 75.7 % (ref 43.0–77.0)
RBC: 4.24 Mil/uL (ref 4.22–5.81)
RDW: 16.8 % — ABNORMAL HIGH (ref 11.5–14.6)
WBC: 14.2 10*3/uL — ABNORMAL HIGH (ref 4.5–10.5)

## 2010-10-16 LAB — SEDIMENTATION RATE: Sed Rate: 70 mm/hr — ABNORMAL HIGH (ref 0–22)

## 2010-10-22 NOTE — Assessment & Plan Note (Signed)
Summary: CPX/NWS   Vital Signs:  Patient profile:   65 year old male Height:      66 inches Weight:      175.13 pounds BMI:     28.37 O2 Sat:      98 % on Room air Temp:     98 degrees F oral Pulse rate:   98 / minute BP sitting:   138 / 90  (left arm) Cuff size:   regular  Vitals Entered By: Zella Ball Ewing CMA Duncan Dull) (October 16, 2010 8:46 AM)  O2 Flow:  Room air  CC: Adult Physical/RE   CC:  Adult Physical/RE.  History of Present Illness: here for wellness and f/u;  overall doing ok ,  did have URI sinusitis type symptoms recently now improved;  elev WBC noted on labs last wk and pt tx empirically with antibx as per emr;  overall much improved today with out fever , HA, sinus pain, pressure, greenish d/c or cough.  Pt denies CP, worsening sob, doe, wheezing, orthopnea, pnd, worsening LE edema, palps, dizziness or syncope  Pt denies new neuro symptoms such as headache, facial or extremity weakness  Pt denies polydipsia, polyuria, or low sugar symptoms such as shakiness improved with eating.  Overall good compliance with meds, trying to follow low chol, DM diet, wt down 20 lbs with better diet, little excercise however   Does not check CBG's at home very often.  Denies worsening depressive symptoms, suicidal ideation, or panic, htough this is very high stress time at work.     Overall good compliance with meds, and good tolerability.  No recent wt loss, night sweats, loss of appetite or other constitutional symptoms  Pt states good ability with ADL's, low fall risk, home safety reviewed and adequate, no significant change in hearing or vision, trying to follow lower chol diet, and occasionally active only with regular excercise. 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 recurring gout.  "felt like i was 65yo" when on the prednisone.  To see orthopedic (dr  Farris Has) recently - no joint issues.  Did have cortisone to shoulders 3 wks ago bilat which also seemed to help the hands as well.  Also doing some excercise three times a day at home for the joints and flexiblity as well.  Problems Prior to Update: 1)  Pain in Joint, Multiple Sites  (ICD-719.49) 2)  Hand Pain, Left  (ICD-729.5) 3)  Leukocytosis  (ICD-288.60) 4)  Special Screening Malig Neoplasms Other Sites  (ICD-V76.49) 5)  Shoulder Pain, Bilateral  (ICD-719.41) 6)  Gout  (ICD-274.9) 7)  Wrist Pain, Right  (ICD-719.43) 8)  Hand Pain, Right  (ICD-729.5) 9)  Diabetes Mellitus, Type II  (ICD-250.00) 10)  Preventive Health Care  (ICD-V70.0) 11)  Colonic Polyps, Hx of  (ICD-V12.72) 12)  Diverticulosis, Colon  (ICD-562.10) 13)  Hypertension  (ICD-401.9) 14)  Family History Diabetes 1st Degree Relative  (ICD-V18.0) 15)  Gerd  (ICD-530.81) 16)  Osteoarthritis, Knee, Left  (ICD-715.96) 17)  Erectile Dysfunction  (ICD-302.72) 18)  Hyperlipidemia  (ICD-272.4) 19)  Preventive Health Care  (ICD-V70.0) 20)  Special Screening Malignant Neoplasm of Prostate  (ICD-V76.44) 21)  Routine General Medical Exam@health  Care Facl  (ICD-V70.0) 22)  Cataract Nos  (ICD-366.9)  Medications Prior to Update: 1)  Omeprazole 20 Mg  Cpdr (Omeprazole) .Marland Kitchen.. 1 By Mouth Once  Daily 2)  Cialis 20 Mg  Tabs (Tadalafil) .Marland Kitchen.. 1 By Mouth Once Daily Prn 3)  Adult Aspirin Ec Low Strength 81 Mg Tbec (Aspirin) .Marland Kitchen.. 1po Once Daily 4)  Metformin Hcl 500 Mg Xr24h-Tab (Metformin Hcl) .Marland Kitchen.. 1po Once Daily 5)  Naproxen 500 Mg Tabs (Naproxen) .Marland Kitchen.. 1po Two Times A Day As Needed 6)  Prednisone 10 Mg Tabs (Prednisone) .... 4po Qd For 3days, Then 3po Qd For 3days, Then 2po Qd For 3days, Then 1po Qd For 3 Days, Then Stop 7)  Indomethacin 50 Mg Caps (Indomethacin) .Marland Kitchen.. 1po Three Times A Day As Needed 8)  Azithromycin 250 Mg Tabs (Azithromycin) .... 2po Qd For 1 Day, Then 1po Qd For 4days, Then Stop  Current Medications (verified): 1)   Omeprazole 20 Mg  Cpdr (Omeprazole) .Marland Kitchen.. 1 By Mouth Once Daily 2)  Cialis 20 Mg  Tabs (Tadalafil) .Marland Kitchen.. 1 By Mouth Once Daily Prn 3)  Adult Aspirin Ec Low Strength 81 Mg Tbec (Aspirin) .Marland Kitchen.. 1po Once Daily 4)  Metformin Hcl 500 Mg Xr24h-Tab (Metformin Hcl) .... 3 Po Once Daily 5)  Naproxen 500 Mg Tabs (Naproxen) .Marland Kitchen.. 1po Two Times A Day As Needed 6)  Allopurinol 100 Mg Tabs (Allopurinol) .Marland Kitchen.. 1po Once Daily  Allergies (verified): No Known Drug Allergies  Past History:  Past Medical History: Last updated: 07/09/2010 Hyperlipidemia E.D. 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         all otherwise negative per pt -    Physical Exam  General:  alert and overweight-appearing.   Head:  normocephalic and atraumatic.   Eyes:  vision grossly intact, pupils equal, and pupils round.   Ears:  R ear  normal and L ear normal.   Nose:  no external deformity and no nasal discharge.   Mouth:  no gingival abnormalities and pharynx pink and moist.   Neck:  supple and no masses.   Lungs:  normal respiratory effort and normal breath sounds.   Heart:  normal rate and regular rhythm.   Abdomen:  soft, non-tender, and normal bowel sounds.   Msk:  no joint tenderness and no joint swelling. , has decresed ROM bialt shoudlers to 100 degrees, bilat hands without active synovitis  Extremities:  no edema, no erythema  Neurologic:  strength normal in all extremities and gait normal.   Skin:  color normal and no rashes.   Psych:  not anxious appearing and not depressed appearing.     Impression & Recommendations:  Problem # 1:  Preventive Health Care (ICD-V70.0) Overall doing well, age appropriate education and counseling updated, referral for preventive services and immunizations addressed, dietary counseling and smoking status adressed ,  most recent labs reviewed, ecg reviewed I have personally reviewed and have noted 1.The patient's medical and social history 2.Their use of alcohol, tobacco or illicit drugs 3.Their current medications and supplements 4. Functional ability including ADL's, fall risk, home safety risk, hearing & visual impairment  5.Diet and physical activities 6.Evidence for depression or mood disorders The patients weight, height, BMI  have been recorded in the chart I have made referrals, counseling and provided education to the patient based review of the above  Orders: EKG w/ Interpretation (93000)  Problem # 2:  SHOULDER PAIN, BILATERAL (ICD-719.41)  The following medications were removed from the medication list:    Indomethacin 50 Mg Caps (Indomethacin) .Marland Kitchen... 1po three times a day as needed His updated medication list for this problem includes:    Adult Aspirin Ec Low Strength 81 Mg Tbec (Aspirin) .Marland Kitchen... 1po once daily    Naproxen 500 Mg Tabs (Naproxen) .Marland Kitchen... 1po two  times a day as needed and hand pain remarkably improved with recent steroid tx - ? subclinical gout - recent uric acid ok, to start  allopurinol 100 mg for now despite normal uric acid due to symtpoms ;  pt states not particularly fond of recent ortho visit and would like second opinion;  will refer to ortho of his choice - Dr sypher/ortho for hand evaluation;  Discussed shoulder exercises, use of moist heat or ice, and medication.   Problem # 3:  LEUKOCYTOSIS (ICD-288.60)  most liekly related to recent infection, but quite elev - will re-check today to see if improved with infectious symtpoms improved;  consider heme consult if not improved  Orders: TLB-CBC Platelet - w/Differential (85025-CBCD)  Problem # 4:  HAND PAIN, LEFT (ICD-729.5) and right hand pain - will check rheum labs as well, and consider rheumatology if alloopurinol adn hand surgury not helping Orders: Orthopedic Surgeon Referral (Ortho Surgeon)  Problem # 5:  DIABETES MELLITUS, TYPE II (ICD-250.00)  His updated medication list for this problem includes:    Adult Aspirin Ec Low Strength 81 Mg Tbec (Aspirin) .Marland Kitchen... 1po once daily    Metformin Hcl 500 Mg Xr24h-tab (Metformin hcl) .Marland KitchenMarland KitchenMarland KitchenMarland Kitchen 3 po once daily  Labs Reviewed: Creat: 0.6 (10/10/2010)    Reviewed HgBA1c results: 8.4 (10/10/2010)  7.5 (10/03/2009) stable overall by hx and exam, ok to continue meds/tx as is   Problem # 6:  HYPERLIPIDEMIA (ICD-272.4)  Labs Reviewed: SGOT: 19 (10/10/2010)   SGPT: 20 (10/10/2010)   HDL:57.50 (10/10/2010), 50.30 (10/03/2009)  LDL:109 (10/10/2010), DEL (08/09/2008)  Chol:188 (10/10/2010), 210 (10/03/2009)  Trig:110.0 (10/10/2010), 295.0 (10/03/2009) d/w pt - declines statin at this time - will re-assess next visit  Problem # 7:  COLONIC POLYPS, HX OF (ICD-V12.72) due for f/u - for referral Orders: Gastroenterology Referral (GI)  Complete Medication List: 1)  Omeprazole 20 Mg Cpdr (Omeprazole) .Marland Kitchen.. 1 by mouth once daily 2)   Cialis 20 Mg Tabs (Tadalafil) .Marland Kitchen.. 1 by mouth once daily prn 3)  Adult Aspirin Ec Low Strength 81 Mg Tbec (Aspirin) .Marland Kitchen.. 1po once daily 4)  Metformin Hcl 500 Mg Xr24h-tab (Metformin hcl) .... 3 po once daily 5)  Naproxen 500 Mg Tabs (Naproxen) .Marland Kitchen.. 1po two times a day as needed 6)  Allopurinol 100 Mg Tabs (Allopurinol) .Marland Kitchen.. 1po once daily  Other Orders: T-RA (Rheumatoid Factor) (16109-60454) T-Antinuclear Antib (ANA) (09811-91478) TLB-Sedimentation Rate (ESR) (85652-ESR)  Patient Instructions: 1)  Please go to the Lab in the basement for your blood tests today - the rpeat cbc, and RA  blood tests 2)  Please call the number on the [facility] for results of your testing  3)  You will be contacted about the referral(s) to: Dr Teressa Senter 4)  please increase the metformin by taking 2 pills for one wk in the AM, then 3 pills after that 5)  Continue all other previous medications as before this visit  6)  You will be contacted about the referral(s) to: colonoscopy 7)  Please schedule a follow-up appointment in 6 months with: 8)  BMP prior to visit, ICD-9: 250.02 9)  Lipid Panel prior to visit, ICD-9: 10)  HbgA1C prior to visit, ICD-9: Prescriptions: ALLOPURINOL 100 MG TABS (ALLOPURINOL) 1po once daily  #90 x 3   Entered and Authorized by:   Corwin Levins MD   Signed by:   Corwin Levins MD on 10/16/2010   Method used:   Electronically to        [facility] [address] [phone]* (retail)       [address] [place], [place]  [address]       Ph: [phone]       Fax: [phone]   RxID:   [id number] NAPROXEN 500 MG TABS (NAPROXEN) 1po two times a day as needed  #60 x 5   Entered and Authorized by:   Corwin Levins MD   Signed by:   Corwin Levins MD on 10/16/2010   Method used:   Electronically to        [facility] [phone]* (retail)       [address] [place], [place]  [address]       Ph: [phone]       Fax: [phone]    RxID:   [id number] METFORMIN HCL 500 MG XR24H-TAB (METFORMIN HCL) 3 po once daily  #270 x 3   Entered and Authorized by:   Corwin Levins MD   Signed by:   Corwin Levins MD on 10/16/2010   Method used:   Electronically to        [facility] [phone]* (retail)       [address] [place], [place]  [address]       Ph: [phone]       Fax: [phone]   RxID:   [id number] CIALIS 20 MG  TABS (TADALAFIL) 1 by mouth once daily prn  #5 x 11   Entered and Authorized by:   Corwin Levins MD   Signed by:   Corwin Levins MD on 10/16/2010   Method used:   Electronically to        [facility] [phone]* (retail)       [address] [place], [place]  [address]       Ph: [phone]       Fax: [phone]   RxID:   [id number] METFORMIN HCL 500 MG XR24H-TAB (METFORMIN HCL) 3 po once daily  #270 x 3   Entered and Authorized by:   Corwin Levins MD   Signed by:   Corwin Levins MD on 10/16/2010   Method used:   Electronically to  [facility]  [address] [phone]* (retail)       [address]       [place], [place]  [address]       Ph: [phone] or [phone]       Fax: [phone]   RxID:   [id number] ALLOPURINOL 100 MG TABS (ALLOPURINOL) 1po once daily  #09 x 3   Entered and Authorized by:   Corwin Levins MD   Signed by:   Corwin Levins MD on 10/16/2010   Method used:   Electronically to        [facility]  [address] [phone]* (retail)       [address]       [place], [place]  [address]       Ph: [phone] or [phone]       Fax: [phone]   RxID:   [id number]    Orders Added: 1)  EKG w/ Interpretation [93000] 2)  T-RA (Rheumatoid Factor) [42706-23762] 3)  T-Antinuclear Antib (ANA) [83151-76160] 4)  TLB-CBC Platelet - w/Differential [85025-CBCD] 5)  TLB-Sedimentation Rate (ESR) [85652-ESR] 6)  Gastroenterology Referral [GI] 7)  Orthopedic Surgeon Referral [Ortho Surgeon] 8)  Est.  Patient 40-64 years [99396] 9)  Est. Patient Level IV [73710]

## 2010-10-30 ENCOUNTER — Other Ambulatory Visit (INDEPENDENT_AMBULATORY_CARE_PROVIDER_SITE_OTHER): Payer: BC Managed Care – PPO

## 2010-10-30 ENCOUNTER — Telehealth: Payer: Self-pay

## 2010-10-30 ENCOUNTER — Other Ambulatory Visit: Payer: Self-pay | Admitting: Internal Medicine

## 2010-10-30 DIAGNOSIS — M2550 Pain in unspecified joint: Secondary | ICD-10-CM

## 2010-10-30 NOTE — Progress Notes (Signed)
Order only - uric acid and ESR requested per Dr Teressa Senter 0- hand ortho;  Will order

## 2010-10-30 NOTE — Telephone Encounter (Signed)
A user error has taken place: encounter opened in error, closed for administrative reasons.

## 2010-10-31 ENCOUNTER — Telehealth: Payer: Self-pay | Admitting: Internal Medicine

## 2010-10-31 NOTE — Telephone Encounter (Signed)
Pt lab results was faxed to Dr Teressa Senter per centricity

## 2010-10-31 NOTE — Progress Notes (Signed)
Faxed copy of labs to requested provider.

## 2010-11-12 ENCOUNTER — Other Ambulatory Visit: Payer: Self-pay | Admitting: Internal Medicine

## 2010-11-12 ENCOUNTER — Telehealth: Payer: Self-pay | Admitting: Internal Medicine

## 2010-11-12 DIAGNOSIS — M791 Myalgia, unspecified site: Secondary | ICD-10-CM

## 2010-11-12 HISTORY — DX: Myalgia, unspecified site: M79.10

## 2010-11-12 NOTE — Assessment & Plan Note (Signed)
Received note per Dr Teressa Senter who suggests rheum referral for ? PMR with elev esr and myalgias  I will refer  Robin to notify pt

## 2010-11-12 NOTE — Telephone Encounter (Signed)
Myalgia - Oliver Barre, MD 11/12/2010 5:36 PM Signed   Received note per Dr Teressa Senter who suggests rheum referral for ? PMR (polymyalgia rheumatica) with elev esr and myalgias   I will refer   Robin to notify pt

## 2010-11-13 NOTE — Telephone Encounter (Signed)
Called patient informed of referral

## 2010-12-22 ENCOUNTER — Ambulatory Visit: Payer: Self-pay | Admitting: Internal Medicine

## 2010-12-27 NOTE — Op Note (Signed)
NAME:  Steve Collins, Steve Collins                 ACCOUNT NO.:  [id number]   MEDICAL RECORD NO.:  [id number]          PATIENT TYPE:  AMB   LOCATION:  DAY                          FACILITY:  [facility]   PHYSICIAN:  Ollen Gross. Vernell Morgans, M.D. DATE OF BIRTH:  11/27/1945   DATE OF PROCEDURE:  05/08/2004  DATE OF DISCHARGE:  05/08/2004                                 OPERATIVE REPORT   PREOPERATIVE DIAGNOSES:  Left inguinal hernia.   POSTOPERATIVE DIAGNOSES:  Left inguinal hernia.   OPERATION PERFORMED:  Left inguinal hernia repair with mesh.   SURGEON:  Ollen Gross. Carolynne Edouard, M.D.   ANESTHESIA:  General.   DESCRIPTION OF PROCEDURE:  After informed consent was obtained, the patient  was brought to the operating room and placed in supine position on the  operating table.  After adequate induction of general anesthesia, the  patient's abdomen and left groin were prepped with Betadine and draped in  the usual sterile manner.  An incision was made from the edge of the pubic  tubercle on the left towards the anterior superior iliac spine for a  distance of about 5 to 6 cm.  This incision was carried down through the  skin and subcutaneous tissue sharply with the electrocautery.  A small  bridging vein was encountered which was clamped with hemostats, divided and  ligated with 3-0 silk ties.  The dissection was carried down sharply with  the electrocautery until the fascia of the external oblique was encountered.  The fascia of the external oblique was opened along its fibers sharply with  Metzenbaum scissors to the apex of the external ring.  A Weitlaner retractor  was used to retract the edge of the wound superiorly and inferiorly.  Blunt  dissection was carried out to the edge of the pubic tubercle until the cord  structures could be surrounded between fingers.  A half inch Penrose drain  was then placed around the cord structures for retraction purposes.  The  cord structures were then gently skeletonized by a  combination of sharp  dissection with electrocautery and blunt dissection with a hemostat.  A  fairly large lipoma of the cord was identified which was dissected out  sharply with the electrocautery and removed.  A hernia sac was identified.  It was able to be dissected away from the rest of the cord structures.  The  sac was opened.  There were no visceral contents within the sac.  The sac  was ligated near its base with a 2-0 silk suture ligature.  The distal sac  was excised and sent to pathology for identification.  The stump of the sac  was then allowed to retract back beneath the transversalis layer.  The floor  of the inguinal canal was generally weak and was reinforced with 0 Vicryl  stitches.  At this point a 3 x 6 piece of mesh was cut to fit.  The mesh was  sewed inferiorly to the shelving edge of the inguinal ligament using a  running 2-0 Prolene stitch.  The mesh was sewed superiorly in an interrupted  fashion with vertical mattress stitches of 2-0 Prolene to the muscular  aponeurotic transversalis strength layer.  Tails were cut in the mesh  laterally that were wrapped around the cord structures and anchored  laterally to the shelving edge of the inguinal ligament.  Once this was  accomplished, the mesh appeared to be in good position without any tension  and the repair looked in good shape.  The wound was then irrigated with  copious amounts of saline. The external oblique was then reapproximated with  a running 2-0 Vicryl stitch.  The wound was then infiltrated with 0.25%  Marcaine.  The subcutaneous fascia was closed with a running 3-0 Vicryl  stitch and the skin was closed with a running 4-0 Monocryl subcuticular  stitch.  Benzoin, Steri-Strips and sterile dressings were applied.  The patient  tolerated the procedure well.  At the end of the case, all sponge, needle  and instrument counts were correct.  The testicle was identified in the sac  on the left side.  The  patient was then awakened and taken to the recovery  room in stable condition.      PST/MEDQ  D:  05/12/2004  T:  05/13/2004  Job:  [id number]

## 2011-04-16 ENCOUNTER — Telehealth: Payer: Self-pay

## 2011-04-16 ENCOUNTER — Other Ambulatory Visit: Payer: BC Managed Care – PPO

## 2011-04-16 NOTE — Telephone Encounter (Signed)
Sorry, as I dont see any BP med on his med list, and cannot confirm by the record he refers to as to pain medication

## 2011-04-16 NOTE — Telephone Encounter (Signed)
Patient is requesting his BP medication refilled. He also was in the hospital in [facility] and a MD there recommended a pain med. For him to start on. If Dr. Jonny Ruiz has received that report he would like a prescription for that medication as well . His pharmacy is [facility] [address] [place].

## 2011-04-17 NOTE — Telephone Encounter (Signed)
Called the patient back and he informed will schedule an OV soon with Dr. Jonny Ruiz as a hospital followup from being in the hospital in [place].

## 2011-04-23 ENCOUNTER — Ambulatory Visit: Payer: BC Managed Care – PPO | Admitting: Internal Medicine

## 2011-05-14 ENCOUNTER — Encounter: Payer: Self-pay | Admitting: Internal Medicine

## 2011-05-14 DIAGNOSIS — Z0001 Encounter for general adult medical examination with abnormal findings: Secondary | ICD-10-CM | POA: Insufficient documentation

## 2011-05-14 DIAGNOSIS — I714 Abdominal aortic aneurysm, without rupture: Secondary | ICD-10-CM | POA: Insufficient documentation

## 2011-05-14 DIAGNOSIS — T07XXXA Unspecified multiple injuries, initial encounter: Secondary | ICD-10-CM | POA: Insufficient documentation

## 2011-05-14 DIAGNOSIS — M353 Polymyalgia rheumatica: Secondary | ICD-10-CM

## 2011-05-14 HISTORY — DX: Polymyalgia rheumatica: M35.3

## 2011-07-17 ENCOUNTER — Ambulatory Visit (INDEPENDENT_AMBULATORY_CARE_PROVIDER_SITE_OTHER): Payer: BC Managed Care – PPO | Admitting: Internal Medicine

## 2011-07-17 ENCOUNTER — Encounter: Payer: Self-pay | Admitting: Internal Medicine

## 2011-07-17 VITALS — BP 132/88 | HR 78 | Temp 98.0°F | Ht 66.0 in | Wt 177.0 lb

## 2011-07-17 DIAGNOSIS — E119 Type 2 diabetes mellitus without complications: Secondary | ICD-10-CM

## 2011-07-17 DIAGNOSIS — R911 Solitary pulmonary nodule: Secondary | ICD-10-CM

## 2011-07-17 DIAGNOSIS — I10 Essential (primary) hypertension: Secondary | ICD-10-CM

## 2011-07-17 DIAGNOSIS — J984 Other disorders of lung: Secondary | ICD-10-CM

## 2011-07-17 DIAGNOSIS — Z0000 Encounter for general adult medical examination without abnormal findings: Secondary | ICD-10-CM

## 2011-07-17 MED ORDER — ENALAPRIL MALEATE 2.5 MG PO TABS: 2.5000 mg | ORAL_TABLET | Freq: Every day | ORAL | Status: DC

## 2011-07-17 MED ORDER — METOPROLOL TARTRATE 25 MG PO TABS: 25.0000 mg | ORAL_TABLET | Freq: Two times a day (BID) | ORAL | Status: DC

## 2011-07-17 NOTE — Patient Instructions (Addendum)
You will be contacted regarding the referral for: CT scan chest without contrast for after Aug 12, 2011 You can decline if you happen to be able to have this done at [facility] during the time of your upcoming procedure Continue all other medications as before; your refills were sent to the pharmacy as requested Please return in  March 2013  with Lab testing done 3-5 days before

## 2011-07-20 ENCOUNTER — Encounter: Payer: Self-pay | Admitting: Internal Medicine

## 2011-07-20 NOTE — Assessment & Plan Note (Signed)
stable overall by hx and exam, most recent data reviewed with pt, and pt to continue medical treatment as before BP Readings from Last 3 Encounters:  07/17/11 132/88  10/16/10 138/90  07/09/10 128/80

## 2011-07-20 NOTE — Progress Notes (Signed)
Subjective:    Patient ID: Steve Collins, male    DOB: February 19, 1946, 65 y.o.   MRN: [id number]  HPI  Here to f/u;  Has copy of writtent report of CXR during last eval at [facility] indicating pulm nodule with paraesoph LN, and asked to f/u with PCP.  Had serious fall Aug 2012 down 14 stairs with mult trauma/fx, medevaced to [facility] trauma center. Is also incidentally on for AAA stent for large AAA next wk.  Here to f/u; overall doing ok,  Pt denies chest pain, increased sob or doe, wheezing, orthopnea, PND, increased LE swelling, palpitations, dizziness or syncope.  Pt denies new neurological symptoms such as new headache, or facial or extremity weakness or numbness   Pt denies polydipsia, polyuria  Pt states overall good compliance with meds, trying to follow lower cholesterol diet, wt overall stable but little exercise however..   Pt denies fever, wt loss, night sweats, loss of appetite, or other constitutional symptoms Past Medical History  Diagnosis Date  . CATARACT NOS 06/08/2007  . COLONIC POLYPS, HX OF 08/17/2008  . DIABETES MELLITUS, TYPE II 10/10/2009  . DIVERTICULOSIS, COLON 08/17/2008  . ERECTILE DYSFUNCTION 06/30/2007  . GERD 06/30/2007  . GOUT 07/09/2010  . HAND PAIN, RIGHT 04/22/2010  . HYPERLIPIDEMIA 06/30/2007  . HYPERTENSION 08/17/2008  . LEUKOCYTOSIS 10/16/2010  . Myalgia 11/12/2010  . OSTEOARTHRITIS, KNEE, LEFT 06/30/2007  . Pain in joint, multiple sites 10/16/2010  . SHOULDER PAIN, BILATERAL 07/09/2010  . WRIST PAIN, RIGHT 07/09/2010  . PMR (polymyalgia rheumatica) 05/14/2011   Past Surgical History  Procedure Date  . Cataract extraction   . Inguinal herniorrhapy   . S.p right knee arthroscopic     surgury in his 30's    reports that he has quit smoking. He does not have any smokeless tobacco history on file. He reports that he drinks alcohol. He reports that he does not use illicit drugs. family history includes Dementia in his father and mother; Diabetes in his other; Gout in his brother and  maternal grandfather; and Parkinsonism in his other. No Known Allergies Current Outpatient Prescriptions on File Prior to Visit  Medication Sig Dispense Refill  . aspirin 81 MG tablet Take 81 mg by mouth daily.        . metFORMIN (GLUMETZA) 500 MG (MOD) 24 hr tablet Take 3 by mouth once daily       . naproxen (NAPROSYN) 500 MG tablet Take 500 mg by mouth 2 (two) times daily as needed.        Marland Kitchen omeprazole (PRILOSEC) 20 MG capsule Take 20 mg by mouth daily.        . tadalafil (CIALIS) 20 MG tablet Take 20 mg by mouth daily as needed.        Marland Kitchen allopurinol (ZYLOPRIM) 100 MG tablet Take 100 mg by mouth daily.         Review of Systems Review of Systems  Constitutional: Negative for diaphoresis and unexpected weight change.  HENT: Negative for drooling and tinnitus.   Eyes: Negative for photophobia and visual disturbance.  Respiratory: Negative for choking and stridor.   Gastrointestinal: Negative for vomiting and blood in stool.  Genitourinary: Negative for hematuria and decreased urine volume.  Musculoskeletal: Negative for gait problem.  Skin: Negative for color change and wound.     Objective:   Physical Exam BP 132/88  Pulse 78  Temp(Src) 98 F (36.7 C) (Oral)  Ht 5\' 6"  (1.676 m)  Wt 177 lb (80.287 kg)  BMI 28.57 kg/m2  SpO2 97% Physical Exam  VS noted, not ill appearing Constitutional: Pt appears well-developed and well-nourished.  HENT: Head: Normocephalic.  Right Ear: External ear normal.  Left Ear: External ear normal.  Eyes: Conjunctivae and EOM are normal. Pupils are equal, round, and reactive to light.  Neck: Normal range of motion. Neck supple.  Cardiovascular: Normal rate and regular rhythm.   Pulmonary/Chest: Effort normal and breath sounds normal.  Neurological: Pt is alert. No cranial nerve deficit.  Skin: Skin is warm. No erythema.  Psychiatric: Pt behavior is normal. Thought content normal.     Assessment & Plan:

## 2011-07-20 NOTE — Assessment & Plan Note (Signed)
With large paraesoph Lymph node per radiology on CXR at [facility] dec 2012, for CT chest but as pt has major procedure planned for next wk, he asks for CT to be done after Aug 12 2011

## 2011-07-20 NOTE — Assessment & Plan Note (Addendum)
Mild uncontrolled overall by hx and exam, most recent data reviewed with pt, and pt to continue medical treatment as before, declines furhter eval or tx prior to upcoming procedure  Lab Results  Component Value Date   HGBA1C 8.4* 10/10/2010

## 2011-07-24 ENCOUNTER — Other Ambulatory Visit: Payer: BC Managed Care – PPO

## 2011-08-20 ENCOUNTER — Ambulatory Visit (INDEPENDENT_AMBULATORY_CARE_PROVIDER_SITE_OTHER)
Admission: RE | Admit: 2011-08-20 | Discharge: 2011-08-20 | Disposition: A | Discharge status: Home / Self Care | Payer: BC Managed Care – PPO | Source: Ambulatory Visit | Attending: Internal Medicine | Admitting: Internal Medicine

## 2011-08-20 DIAGNOSIS — R911 Solitary pulmonary nodule: Secondary | ICD-10-CM

## 2011-08-20 DIAGNOSIS — J984 Other disorders of lung: Secondary | ICD-10-CM

## 2011-09-23 ENCOUNTER — Encounter: Payer: Self-pay | Admitting: Internal Medicine

## 2011-11-06 ENCOUNTER — Encounter: Payer: BC Managed Care – PPO | Admitting: Internal Medicine

## 2012-01-21 ENCOUNTER — Encounter: Payer: Self-pay | Admitting: Internal Medicine

## 2012-01-21 ENCOUNTER — Other Ambulatory Visit (INDEPENDENT_AMBULATORY_CARE_PROVIDER_SITE_OTHER): Payer: Medicare Other

## 2012-01-21 ENCOUNTER — Ambulatory Visit (INDEPENDENT_AMBULATORY_CARE_PROVIDER_SITE_OTHER): Payer: Medicare Other | Admitting: Internal Medicine

## 2012-01-21 ENCOUNTER — Other Ambulatory Visit: Payer: Self-pay

## 2012-01-21 VITALS — BP 140/90 | HR 58 | Temp 97.0°F | Ht 66.0 in | Wt 185.6 lb

## 2012-01-21 DIAGNOSIS — Z0000 Encounter for general adult medical examination without abnormal findings: Secondary | ICD-10-CM

## 2012-01-21 DIAGNOSIS — E119 Type 2 diabetes mellitus without complications: Secondary | ICD-10-CM

## 2012-01-21 DIAGNOSIS — Z125 Encounter for screening for malignant neoplasm of prostate: Secondary | ICD-10-CM

## 2012-01-21 LAB — URINALYSIS, ROUTINE W REFLEX MICROSCOPIC
Nitrite: NEGATIVE
Specific Gravity, Urine: >=1.03 (ref 1.000–1.030)
Total Protein, Urine: NEGATIVE
Urine Glucose: NEGATIVE
pH: 5.5 (ref 5.0–8.0)

## 2012-01-21 LAB — CBC WITH DIFFERENTIAL/PLATELET
Basophils Relative: 0.2 % (ref 0.0–3.0)
Eosinophils Absolute: 0.1 10*3/uL (ref 0.0–0.7)
Eosinophils Relative: 1.4 % (ref 0.0–5.0)
Hemoglobin: 14 g/dL (ref 13.0–17.0)
Lymphocytes Relative: 22.7 % (ref 12.0–46.0)
MCHC: 33.9 g/dL (ref 30.0–36.0)
Neutro Abs: 6.4 10*3/uL (ref 1.4–7.7)
Neutrophils Relative %: 70.6 % (ref 43.0–77.0)
RBC: 4.49 Mil/uL (ref 4.22–5.81)
WBC: 9 10*3/uL (ref 4.5–10.5)

## 2012-01-21 LAB — BASIC METABOLIC PANEL
BUN: 26 mg/dL — ABNORMAL HIGH (ref 6–23)
CO2: 28 mEq/L (ref 19–32)
Calcium: 9.5 mg/dL (ref 8.4–10.5)
Chloride: 103 mEq/L (ref 96–112)
Creatinine, Ser: 0.9 mg/dL (ref 0.4–1.5)

## 2012-01-21 LAB — LIPID PANEL
Cholesterol: 195 mg/dL (ref 0–200)
HDL: 46.7 mg/dL (ref >39.00)
LDL Cholesterol: 114 mg/dL — ABNORMAL HIGH (ref 0–99)
Total CHOL/HDL Ratio: 4
Triglycerides: 172 mg/dL — ABNORMAL HIGH (ref 0.0–149.0)

## 2012-01-21 LAB — PSA: PSA: 3.99 ng/mL (ref 0.10–4.00)

## 2012-01-21 LAB — HEPATIC FUNCTION PANEL
ALT: 21 U/L (ref 0–53)
Bilirubin, Direct: 0.1 mg/dL (ref 0.0–0.3)
Total Bilirubin: 0.4 mg/dL (ref 0.3–1.2)
Total Protein: 6.9 g/dL (ref 6.0–8.3)

## 2012-01-21 MED ORDER — TADALAFIL 20 MG PO TABS: 20.0000 mg | ORAL_TABLET | Freq: Every day | ORAL | Status: DC | PRN

## 2012-01-21 MED ORDER — METFORMIN HCL ER (MOD) 500 MG PO TB24: ORAL_TABLET | ORAL | Status: DC

## 2012-01-21 MED ORDER — NAPROXEN 500 MG PO TABS: 500.0000 mg | ORAL_TABLET | Freq: Two times a day (BID) | ORAL | Status: DC | PRN

## 2012-01-21 MED ORDER — METFORMIN HCL ER 500 MG PO TB24: 1500.0000 mg | ORAL_TABLET | Freq: Every day | ORAL | Status: DC

## 2012-01-21 NOTE — Patient Instructions (Addendum)
Continue all other medications as before Your refills were done as requested today Please have the pharmacy call with any refills you may need. Please continue your efforts at being more active, low cholesterol diet, and weight control. Please keep your appointments with your specialists as you have planned  - the every 3 mo CT's at [facility], and orthopedic for your shoulder Please go to LAB in the Basement for the blood and/or urine tests to be done today You will be contacted by phone if any changes need to be made immediately.  Otherwise, you will receive a letter about your results with an explanation. Please return in 6 mo with Lab testing done 3-5 days before

## 2012-01-21 NOTE — Assessment & Plan Note (Signed)
stable overall by hx and exam, most recent data reviewed with pt, and pt to continue medical treatment as before Lab Results  Component Value Date   HGBA1C 8.4* 10/10/2010

## 2012-01-21 NOTE — Assessment & Plan Note (Signed)

## 2012-01-21 NOTE — Progress Notes (Signed)
Subjective:    Patient ID: Steve Collins, male    DOB: 19-Jan-1946, 66 y.o.   MRN: [id number]  HPI  Here for wellness and f/u;  Overall doing ok;  Pt denies CP, worsening SOB, DOE, wheezing, orthopnea, PND, worsening LE edema, palpitations, dizziness or syncope.  Pt denies neurological change such as new Headache, facial or extremity weakness.  Pt denies polydipsia, polyuria, or low sugar symptoms. Pt states overall good compliance with treatment and medications, good tolerability, and trying to follow lower cholesterol diet.  Pt denies worsening depressive symptoms, suicidal ideation or panic. No fever, wt loss, night sweats, loss of appetite, or other constitutional symptoms.  Pt states good ability with ADL's, low fall risk, home safety reviewed and adequate, no significant changes in hearing or vision, and occasionally active with exercise.  Still with limited left shoulder ROM with some mild discomfort to abduction, plans to see orthopedic soon, doing his own PT at the gym.  Gets every 3 mo CT at [facility] for his aortic stent.   Past Medical History  Diagnosis Date  . CATARACT NOS 06/08/2007  . COLONIC POLYPS, HX OF 08/17/2008  . DIABETES MELLITUS, TYPE II 10/10/2009  . DIVERTICULOSIS, COLON 08/17/2008  . ERECTILE DYSFUNCTION 06/30/2007  . GERD 06/30/2007  . GOUT 07/09/2010  . HAND PAIN, RIGHT 04/22/2010  . HYPERLIPIDEMIA 06/30/2007  . HYPERTENSION 08/17/2008  . LEUKOCYTOSIS 10/16/2010  . Myalgia 11/12/2010  . OSTEOARTHRITIS, KNEE, LEFT 06/30/2007  . Pain in joint, multiple sites 10/16/2010  . SHOULDER PAIN, BILATERAL 07/09/2010  . WRIST PAIN, RIGHT 07/09/2010  . PMR (polymyalgia rheumatica) 05/14/2011   Past Surgical History  Procedure Date  . Cataract extraction   . Inguinal herniorrhapy   . S.p right knee arthroscopic     surgury in his 30's    reports that he has quit smoking. He does not have any smokeless tobacco history on file. He reports that he drinks alcohol. He reports that he does not use  illicit drugs. family history includes Dementia in his father and mother; Diabetes in his other; Gout in his brother and maternal grandfather; and Parkinsonism in his other. No Known Allergies Current Outpatient Prescriptions on File Prior to Visit  Medication Sig Dispense Refill  . aspirin 81 MG tablet Take 81 mg by mouth daily.        . enalapril (VASOTEC) 2.5 MG tablet Take 1 tablet (2.5 mg total) by mouth daily.  90 tablet  3  . metFORMIN (GLUMETZA) 500 MG (MOD) 24 hr tablet Take 3 by mouth once daily       . metoprolol tartrate (LOPRESSOR) 25 MG tablet Take 1 tablet (25 mg total) by mouth 2 (two) times daily.  180 tablet  3  . naproxen (NAPROSYN) 500 MG tablet Take 500 mg by mouth 2 (two) times daily as needed.        Marland Kitchen omeprazole (PRILOSEC) 20 MG capsule Take 20 mg by mouth daily.        . tadalafil (CIALIS) 20 MG tablet Take 20 mg by mouth daily as needed.        Marland Kitchen allopurinol (ZYLOPRIM) 100 MG tablet Take 100 mg by mouth daily.         Review of Systems Review of Systems  Constitutional: Negative for diaphoresis, activity change, appetite change and unexpected weight change.  HENT: Negative for hearing loss, ear pain, facial swelling, mouth sores and neck stiffness.   Eyes: Negative for pain, redness and visual disturbance.  Respiratory: Negative  for shortness of breath and wheezing.   Cardiovascular: Negative for chest pain and palpitations.  Gastrointestinal: Negative for diarrhea, blood in stool, abdominal distention and rectal pain.  Genitourinary: Negative for hematuria, flank pain and decreased urine volume.  Musculoskeletal: Negative for myalgias and joint swelling.  Skin: Negative for color change and wound.  Neurological: Negative for syncope and numbness.  Hematological: Negative for adenopathy.  Psychiatric/Behavioral: Negative for hallucinations, self-injury, decreased concentration and agitation.      Objective:   Physical Exam BP 140/90  Pulse 58  Temp 97 F  (36.1 C) (Oral)  Ht 5\' 6"  (1.676 m)  Wt 185 lb 9 oz (84.171 kg)  BMI 29.95 kg/m2  SpO2 93% Physical Exam  VS noted Constitutional: Pt is oriented to person, place, and time. Appears well-developed and well-nourished.  Head: Normocephalic and atraumatic.  Right Ear: External ear normal.  Left Ear: External ear normal.  Nose: Nose normal.  Mouth/Throat: Oropharynx is clear and moist.  Eyes: Conjunctivae and EOM are normal. Pupils are equal, round, and reactive to light.  Neck: Normal range of motion. Neck supple. No JVD present. No tracheal deviation present.  Cardiovascular: Normal rate, regular rhythm, normal heart sounds and intact distal pulses.   Pulmonary/Chest: Effort normal and breath sounds normal.  Abdominal: Soft. Bowel sounds are normal. There is no tenderness.  Musculoskeletal: Normal range of motion. Exhibits no edema.  Lymphadenopathy:  Has no cervical adenopathy.  Neurological: Pt is alert and oriented to person, place, and time. Pt has normal reflexes. No cranial nerve deficit.  Skin: Skin is warm and dry. No rash noted.  Psychiatric:  Has  normal mood and affect. Behavior is normal. not depressed affect Left shoudler with decreased ROM to 90 degrees abduction and forward elev only    Assessment & Plan:

## 2012-02-21 ENCOUNTER — Other Ambulatory Visit: Payer: Self-pay | Admitting: Internal Medicine

## 2012-07-14 ENCOUNTER — Other Ambulatory Visit (INDEPENDENT_AMBULATORY_CARE_PROVIDER_SITE_OTHER): Payer: Medicare Other

## 2012-07-14 DIAGNOSIS — E119 Type 2 diabetes mellitus without complications: Secondary | ICD-10-CM

## 2012-07-14 LAB — BASIC METABOLIC PANEL
CO2: 29 mEq/L (ref 19–32)
Calcium: 9.5 mg/dL (ref 8.4–10.5)
Glucose, Bld: 152 mg/dL — ABNORMAL HIGH (ref 70–99)
Potassium: 4.8 mEq/L (ref 3.5–5.1)
Sodium: 137 mEq/L (ref 135–145)

## 2012-07-14 LAB — LIPID PANEL
Total CHOL/HDL Ratio: 5
Triglycerides: 280 mg/dL — ABNORMAL HIGH (ref 0.0–149.0)

## 2012-07-18 ENCOUNTER — Other Ambulatory Visit: Payer: Self-pay | Admitting: Internal Medicine

## 2012-07-19 ENCOUNTER — Telehealth: Payer: Self-pay

## 2012-07-19 MED ORDER — ENALAPRIL MALEATE 2.5 MG PO TABS: 2.5000 mg | ORAL_TABLET | Freq: Every day | ORAL | Status: DC

## 2012-07-19 NOTE — Telephone Encounter (Signed)
rx filled

## 2012-07-21 ENCOUNTER — Ambulatory Visit: Payer: Medicare Other | Admitting: Internal Medicine

## 2012-07-23 ENCOUNTER — Encounter: Payer: Self-pay | Admitting: Internal Medicine

## 2012-07-23 ENCOUNTER — Ambulatory Visit (INDEPENDENT_AMBULATORY_CARE_PROVIDER_SITE_OTHER): Payer: Medicare Other | Admitting: Internal Medicine

## 2012-07-23 VITALS — BP 122/92 | HR 90 | Temp 98.1°F | Ht 66.0 in | Wt 182.4 lb

## 2012-07-23 DIAGNOSIS — R05 Cough: Secondary | ICD-10-CM | POA: Insufficient documentation

## 2012-07-23 DIAGNOSIS — Z23 Encounter for immunization: Secondary | ICD-10-CM

## 2012-07-23 DIAGNOSIS — E119 Type 2 diabetes mellitus without complications: Secondary | ICD-10-CM

## 2012-07-23 DIAGNOSIS — Z0000 Encounter for general adult medical examination without abnormal findings: Secondary | ICD-10-CM

## 2012-07-23 DIAGNOSIS — R059 Cough, unspecified: Secondary | ICD-10-CM

## 2012-07-23 DIAGNOSIS — I10 Essential (primary) hypertension: Secondary | ICD-10-CM

## 2012-07-23 MED ORDER — METHYLPREDNISOLONE ACETATE 80 MG/ML IJ SUSP
120.0000 mg | Freq: Once | INTRAMUSCULAR | Status: AC
  Administered 2012-07-23: 120 mg via INTRAMUSCULAR

## 2012-07-23 MED ORDER — PREDNISONE 10 MG PO TABS: ORAL_TABLET | ORAL | Status: DC

## 2012-07-23 NOTE — Patient Instructions (Addendum)
You had the flu shot today You had the steroid shot today OK to stop the prilosec Please change and start the generic protonix 40 mg per day Take all new medications as prescribed - the prednisone Continue all other medications as before Please continue your efforts at being more active, low cholesterol diet, and weight control. Please keep your appointments with your specialists as you have planned Thank you for enrolling in MyChart. Please follow the instructions below to securely access your online medical record. MyChart allows you to send messages to your doctor, view your test results, renew your prescriptions, schedule appointments, and more. To Log into MyChart, please go to [email or web address], and your Username is: [phone], password [phone] Please return in 6 mo with Lab testing done 3-5 days before We will try to get your [facility] test results for labs and the contrast procedure from yesterday

## 2012-07-23 NOTE — Progress Notes (Signed)
Subjective:    Patient ID: Steve Collins, male    DOB: 12/21/1945, 66 y.o.   MRN: [id number]  HPI   Here to f/u; overall doing ok,  Pt denies chest pain, increased sob or doe, wheezing, orthopnea, PND, increased LE swelling, palpitations, dizziness or syncope.  Pt denies new neurological symptoms such as new headache, or facial or extremity weakness or numbness   Pt denies polydipsia, polyuria,.  Pt states overall good compliance with meds, trying to follow lower cholesterol, diet, wt overall stable but little exercise however.  Pt has been trying to "wean" off the prilosec in the last few wks now with worsening GI complaints today;  Was seen per Dr Rozetta [facility]unner[facility] yesterday, has some sort of study with contrast dye to which he thinks may be related; unfortunately has n/v at home last night after the procedure, has not been called about the results;  Did get some soup, water/grapes last night, tried antacid but not sure if helped, as well as this am, had what he feels was regurgitation this am, keeps coughing last night and today, but hoping it will get better.  For flu shot today Past Medical History  Diagnosis Date  . CATARACT NOS 06/08/2007  . COLONIC POLYPS, HX OF 08/17/2008  . DIABETES MELLITUS, TYPE II 10/10/2009  . DIVERTICULOSIS, COLON 08/17/2008  . ERECTILE DYSFUNCTION 06/30/2007  . GERD 06/30/2007  . GOUT 07/09/2010  . HAND PAIN, RIGHT 04/22/2010  . HYPERLIPIDEMIA 06/30/2007  . HYPERTENSION 08/17/2008  . LEUKOCYTOSIS 10/16/2010  . Myalgia 11/12/2010  . OSTEOARTHRITIS, KNEE, LEFT 06/30/2007  . Pain in joint, multiple sites 10/16/2010  . SHOULDER PAIN, BILATERAL 07/09/2010  . WRIST PAIN, RIGHT 07/09/2010  . PMR (polymyalgia rheumatica) 05/14/2011   Past Surgical History  Procedure Date  . Cataract extraction   . Inguinal herniorrhapy   . S.p right knee arthroscopic     surgury in his 30's    reports that he has quit smoking. He does not have any smokeless tobacco history on file. He  reports that he drinks alcohol. He reports that he does not use illicit drugs. family history includes Dementia in his father and mother; Diabetes in his other; Gout in his brother and maternal grandfather; and Parkinsonism in his other. No Known Allergies Current Outpatient Prescriptions on File Prior to Visit  Medication Sig Dispense Refill  . aspirin 81 MG tablet Take 81 mg by mouth daily.        Marland Kitchen CIALIS 20 MG tablet TAKE 1 TABLET BY MOUTH EVERY DAY AS NEEDED  5 tablet  0  . enalapril (VASOTEC) 2.5 MG tablet Take 1 tablet (2.5 mg total) by mouth daily.  90 tablet  2  . metFORMIN (GLUCOPHAGE-XR) 500 MG 24 hr tablet Take 3 tablets (1,500 mg total) by mouth daily with breakfast.  270 tablet  3  . metoprolol tartrate (LOPRESSOR) 25 MG tablet TAKE 1 TABLET BY MOUTH TWICE DAILY  180 tablet  2  . naproxen (NAPROSYN) 500 MG tablet Take 1 tablet (500 mg total) by mouth 2 (two) times daily as needed.  180 tablet  3  . omeprazole (PRILOSEC) 20 MG capsule Take 20 mg by mouth daily.        Marland Kitchen allopurinol (ZYLOPRIM) 100 MG tablet Take 100 mg by mouth daily.         Review of Systems  Constitutional: Negative for diaphoresis and unexpected weight change.  HENT: Negative for tinnitus.   Eyes: Negative for photophobia and visual  disturbance.  Respiratory: Negative for choking and stridor.   Gastrointestinal: Negative for vomiting and blood in stool.  Genitourinary: Negative for hematuria and decreased urine volume.  Musculoskeletal: Negative for gait problem.  Skin: Negative for color change and wound.  Neurological: Negative for tremors and numbness.  Psychiatric/Behavioral: Negative for decreased concentration. The patient is not hyperactive.       Objective:   Physical Exam BP 122/92  Pulse 90  Temp 98.1 F (36.7 C) (Oral)  Ht 5\' 6"  (1.676 m)  Wt 182 lb 6 oz (82.725 kg)  BMI 29.44 kg/m2  SpO2 97% Physical Exam  VS noted, appears nauseas Constitutional: Pt appears well-developed and  well-nourished.  HENT: Head: Normocephalic.  Right Ear: External ear normal.  Left Ear: External ear normal.  Eyes: Conjunctivae and EOM are normal. Pupils are equal, round, and reactive to light.  Neck: Normal range of motion. Neck supple.  Cardiovascular: Normal rate and regular rhythm.   Pulmonary/Chest: Effort normal and breath sounds normal.  Abd:  Soft, NT, non-distended, + BS - benign exam Neurological: Pt is alert. Not confused  Skin: Skin is warm. No erythema.  Psychiatric: Pt behavior is normal. Thought content normal.     Assessment & Plan:

## 2012-07-24 ENCOUNTER — Encounter: Payer: Self-pay | Admitting: Internal Medicine

## 2012-07-24 NOTE — Assessment & Plan Note (Signed)
stable overall by hx and exam, most recent data reviewed with pt, and pt to continue medical treatment as before BP Readings from Last 3 Encounters:  07/23/12 122/92  01/21/12 140/90  07/17/11 132/88

## 2012-07-24 NOTE — Assessment & Plan Note (Signed)
stable overall by hx and exam, most recent data reviewed with pt, and pt to continue medical treatment as before, to cont to monitor cbg's, call for > 200 on prednisone

## 2012-07-24 NOTE — Assessment & Plan Note (Signed)
Unclear etiology, ? Esoph/pulm reflex vs allergic type rxn vs other, declines cxr, for change of PPI to protonix, and predpack asd

## 2012-07-27 ENCOUNTER — Encounter: Payer: Self-pay | Admitting: Internal Medicine

## 2012-07-27 DIAGNOSIS — I2510 Atherosclerotic heart disease of native coronary artery without angina pectoris: Secondary | ICD-10-CM

## 2012-07-27 HISTORY — DX: Atherosclerotic heart disease of native coronary artery without angina pectoris: I25.10

## 2012-08-09 ENCOUNTER — Other Ambulatory Visit: Payer: Self-pay

## 2012-08-09 MED ORDER — METFORMIN HCL ER 500 MG PO TB24: 1500.0000 mg | ORAL_TABLET | Freq: Every day | ORAL | Status: DC

## 2012-08-09 MED ORDER — ENALAPRIL MALEATE 2.5 MG PO TABS: 2.5000 mg | ORAL_TABLET | Freq: Every day | ORAL | Status: DC

## 2012-08-09 MED ORDER — METOPROLOL TARTRATE 25 MG PO TABS: 25.0000 mg | ORAL_TABLET | Freq: Two times a day (BID) | ORAL | Status: DC

## 2012-11-24 ENCOUNTER — Other Ambulatory Visit: Payer: Self-pay

## 2012-11-24 MED ORDER — TADALAFIL 20 MG PO TABS: 20.0000 mg | ORAL_TABLET | Freq: Every day | ORAL | Status: DC | PRN

## 2013-01-20 ENCOUNTER — Other Ambulatory Visit (INDEPENDENT_AMBULATORY_CARE_PROVIDER_SITE_OTHER): Payer: Medicare Other

## 2013-01-20 DIAGNOSIS — Z0000 Encounter for general adult medical examination without abnormal findings: Secondary | ICD-10-CM

## 2013-01-20 DIAGNOSIS — E119 Type 2 diabetes mellitus without complications: Secondary | ICD-10-CM

## 2013-01-20 LAB — BASIC METABOLIC PANEL
CO2: 27 mEq/L (ref 19–32)
Calcium: 9.2 mg/dL (ref 8.4–10.5)
Chloride: 103 mEq/L (ref 96–112)
Glucose, Bld: 129 mg/dL — ABNORMAL HIGH (ref 70–99)
Sodium: 137 mEq/L (ref 135–145)

## 2013-01-20 LAB — CBC WITH DIFFERENTIAL/PLATELET
Basophils Absolute: 0 10*3/uL (ref 0.0–0.1)
Eosinophils Absolute: 0.2 10*3/uL (ref 0.0–0.7)
Hemoglobin: 14.2 g/dL (ref 13.0–17.0)
Lymphocytes Relative: 21.6 % (ref 12.0–46.0)
Monocytes Relative: 5.9 % (ref 3.0–12.0)
Neutrophils Relative %: 70.7 % (ref 43.0–77.0)
Platelets: 218 10*3/uL (ref 150.0–400.0)
RDW: 14.8 % — ABNORMAL HIGH (ref 11.5–14.6)

## 2013-01-20 LAB — URINALYSIS, ROUTINE W REFLEX MICROSCOPIC
Ketones, ur: NEGATIVE
RBC / HPF: NONE SEEN (ref 0–?)
Specific Gravity, Urine: 1.025 (ref 1.000–1.030)
Total Protein, Urine: NEGATIVE
Urine Glucose: NEGATIVE
Urobilinogen, UA: 0.2 (ref 0.0–1.0)
WBC, UA: NONE SEEN (ref 0–?)

## 2013-01-20 LAB — HEPATIC FUNCTION PANEL
ALT: 20 U/L (ref 0–53)
AST: 21 U/L (ref 0–37)
Albumin: 3.8 g/dL (ref 3.5–5.2)
Alkaline Phosphatase: 71 U/L (ref 39–117)
Total Protein: 6.9 g/dL (ref 6.0–8.3)

## 2013-01-20 LAB — LIPID PANEL: Triglycerides: 233 mg/dL — ABNORMAL HIGH (ref 0.0–149.0)

## 2013-01-20 LAB — MICROALBUMIN / CREATININE URINE RATIO
Creatinine,U: 105.8 mg/dL
Microalb Creat Ratio: 0.3 mg/g (ref 0.0–30.0)

## 2013-01-20 LAB — LDL CHOLESTEROL, DIRECT: Direct LDL: 111.3 mg/dL

## 2013-01-20 LAB — PSA: PSA: 6.49 ng/mL — ABNORMAL HIGH (ref 0.10–4.00)

## 2013-01-26 ENCOUNTER — Ambulatory Visit (INDEPENDENT_AMBULATORY_CARE_PROVIDER_SITE_OTHER): Payer: Medicare Other | Admitting: Internal Medicine

## 2013-01-26 ENCOUNTER — Encounter: Payer: Self-pay | Admitting: Internal Medicine

## 2013-01-26 ENCOUNTER — Telehealth: Payer: Self-pay

## 2013-01-26 VITALS — BP 112/80 | HR 62 | Temp 97.4°F | Ht 66.0 in | Wt 188.1 lb

## 2013-01-26 DIAGNOSIS — E119 Type 2 diabetes mellitus without complications: Secondary | ICD-10-CM

## 2013-01-26 DIAGNOSIS — Z0000 Encounter for general adult medical examination without abnormal findings: Secondary | ICD-10-CM

## 2013-01-26 DIAGNOSIS — L719 Rosacea, unspecified: Secondary | ICD-10-CM

## 2013-01-26 DIAGNOSIS — H18519 Endothelial corneal dystrophy, unspecified eye: Secondary | ICD-10-CM | POA: Insufficient documentation

## 2013-01-26 DIAGNOSIS — R972 Elevated prostate specific antigen [PSA]: Secondary | ICD-10-CM | POA: Insufficient documentation

## 2013-01-26 DIAGNOSIS — IMO0001 Reserved for inherently not codable concepts without codable children: Secondary | ICD-10-CM

## 2013-01-26 MED ORDER — METRONIDAZOLE 1 % EX GEL: Freq: Every day | CUTANEOUS | Status: AC

## 2013-01-26 MED ORDER — ATORVASTATIN CALCIUM 10 MG PO TABS: 10.0000 mg | ORAL_TABLET | Freq: Every day | ORAL | Status: DC

## 2013-01-26 MED ORDER — ENALAPRIL MALEATE 2.5 MG PO TABS: 2.5000 mg | ORAL_TABLET | Freq: Every day | ORAL | Status: DC

## 2013-01-26 MED ORDER — OMEPRAZOLE 20 MG PO CPDR: 20.0000 mg | DELAYED_RELEASE_CAPSULE | Freq: Every day | ORAL | Status: DC

## 2013-01-26 MED ORDER — TADALAFIL 20 MG PO TABS: 20.0000 mg | ORAL_TABLET | Freq: Every day | ORAL | Status: DC | PRN

## 2013-01-26 MED ORDER — METOPROLOL TARTRATE 25 MG PO TABS: 25.0000 mg | ORAL_TABLET | Freq: Two times a day (BID) | ORAL | Status: DC

## 2013-01-26 MED ORDER — METFORMIN HCL ER 500 MG PO TB24: 1000.0000 mg | ORAL_TABLET | Freq: Every day | ORAL | Status: DC

## 2013-01-26 MED ORDER — CIPROFLOXACIN HCL 500 MG PO TABS: 500.0000 mg | ORAL_TABLET | Freq: Two times a day (BID) | ORAL | Status: DC

## 2013-01-26 NOTE — Assessment & Plan Note (Addendum)
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

## 2013-01-26 NOTE — Assessment & Plan Note (Signed)
Marked elev over 1 yr ago, Lab Results  Component Value Date   PSA 6.49* 01/20/2013   PSA 3.99 01/21/2012   PSA 3.06 10/10/2010   For cipro 500 bid x 4 wks, with repeat PSA, if still elev will need urology referral

## 2013-01-26 NOTE — Assessment & Plan Note (Signed)
stable overall by history and exam, recent data reviewed with pt, and pt to continue medical treatment as before,  to f/u any worsening symptoms or concerns Lab Results  Component Value Date   HGBA1C 7.0* 01/20/2013

## 2013-01-26 NOTE — Patient Instructions (Addendum)
Your EKG was ok today Your blood work was OK except for the PSA increased , and the LDL cholesterol is too high Please take all new medication as prescribed - the cipro antibiotic for 4 wks only, and the lipitor 10 mg per day, and the metformin at  Pills per day as you have been doing, as well as the gel trial for the face Please continue all other medications as before, and refills have been done if requested. Please return for LAB  Only in 4 wks for repeat PSA after the cipro is done If the PSA is still elevated, we will likely need to refer you to Urology Please continue your efforts at being more active, low cholesterol diet, and weight control. You are otherwise up to date with prevention measures today. Please keep your appointments with your specialists as you have planned  Please remember to sign up for My Chart if you have not done so, as this will be important to you in the future with finding out test results, communicating by private email, and scheduling acute appointments online when needed.  Please return in 6 months, or sooner if needed, with Lab testing done 3-5 days before

## 2013-01-26 NOTE — Progress Notes (Signed)
Subjective:    Patient ID: Steve Collins, male    DOB: 1945/12/24, 67 y.o.   MRN: [id number]  HPI  Here for wellness and f/u;  Overall doing ok;  Pt denies CP, worsening SOB, DOE, wheezing, orthopnea, PND, worsening LE edema, palpitations, dizziness or syncope.  Pt denies neurological change such as new headache, facial or extremity weakness.  Pt denies polydipsia, polyuria, or low sugar symptoms. Pt states overall good compliance with treatment and medications, good tolerability, and has been trying to follow lower cholesterol diet.  Pt denies worsening depressive symptoms, suicidal ideation or panic. No fever, night sweats, wt loss, loss of appetite, or other constitutional symptoms.  Pt states good ability with ADL's, has low fall risk, home safety reviewed and adequate, no other significant changes in hearing or vision, and only occasionally active with exercise.  Has seen his labs on mychart prior to visit, no complants.  Has nontender rash to face worse in the past few months.  Brother with rosacea. Not taking naproxen futther for arthritis, seems better lately. Wants to try to stay off allopurinol for now, no recent gout attacks. On new eye drops per optho, has a rare condition - fuchs dystrophy syndrome, seeing [facility], now s/p right corneal transplant, due for left soon.  Only taking 1000 metformin, some confusion despite his last rx for 1500 qd. Due for colonoscopy but waiting on scheduling due to eye treatment.  Denies urinary symptoms such as dysuria, frequency, urgency, flank pain, hematuria or n/v, fever, chills, no hx of prostatitis.   Past Medical History  Diagnosis Date  . CATARACT NOS 06/08/2007  . COLONIC POLYPS, HX OF 08/17/2008  . DIABETES MELLITUS, TYPE II 10/10/2009  . DIVERTICULOSIS, COLON 08/17/2008  . ERECTILE DYSFUNCTION 06/30/2007  . GERD 06/30/2007  . GOUT 07/09/2010  . HAND PAIN, RIGHT 04/22/2010  . HYPERLIPIDEMIA 06/30/2007  . HYPERTENSION 08/17/2008  . LEUKOCYTOSIS  10/16/2010  . Myalgia 11/12/2010  . OSTEOARTHRITIS, KNEE, LEFT 06/30/2007  . Pain in joint, multiple sites 10/16/2010  . SHOULDER PAIN, BILATERAL 07/09/2010  . WRIST PAIN, RIGHT 07/09/2010  . PMR (polymyalgia rheumatica) 05/14/2011  . Coronary artery calcification seen on CAT scan 07/27/2012   Past Surgical History  Procedure Laterality Date  . Cataract extraction    . Inguinal herniorrhapy    . S.p right knee arthroscopic      surgury in his 30's    reports that he has quit smoking. He does not have any smokeless tobacco history on file. He reports that  drinks alcohol. He reports that he does not use illicit drugs. family history includes Dementia in his father and mother; Diabetes in his other; Gout in his brother and maternal grandfather; and Parkinsonism in his other. No Known Allergies Current Outpatient Prescriptions on File Prior to Visit  Medication Sig Dispense Refill  . allopurinol (ZYLOPRIM) 100 MG tablet Take 100 mg by mouth daily.        Marland Kitchen aspirin 81 MG tablet Take 81 mg by mouth daily.        . enalapril (VASOTEC) 2.5 MG tablet Take 1 tablet (2.5 mg total) by mouth daily.  90 tablet  3  . metFORMIN (GLUCOPHAGE-XR) 500 MG 24 hr tablet Take 3 tablets (1,500 mg total) by mouth daily with breakfast.  270 tablet  3  . metoprolol tartrate (LOPRESSOR) 25 MG tablet Take 1 tablet (25 mg total) by mouth 2 (two) times daily.  180 tablet  3  . naproxen (NAPROSYN) 500 MG  tablet Take 1 tablet (500 mg total) by mouth 2 (two) times daily as needed.  180 tablet  3  . omeprazole (PRILOSEC) 20 MG capsule Take 20 mg by mouth daily.        . predniSONE (DELTASONE) 10 MG tablet 2 tabs by mouth per day for 5 days  10 tablet  0  . tadalafil (CIALIS) 20 MG tablet Take 1 tablet (20 mg total) by mouth daily as needed for erectile dysfunction.  5 tablet  6   No current facility-administered medications on file prior to visit.   Review of Systems Constitutional: Negative for diaphoresis, activity  change, appetite change or unexpected weight change.  HENT: Negative for hearing loss, ear pain, facial swelling, mouth sores and neck stiffness.   Eyes: Negative for pain, redness and visual disturbance.  Respiratory: Negative for shortness of breath and wheezing.   Cardiovascular: Negative for chest pain and palpitations.  Gastrointestinal: Negative for diarrhea, blood in stool, abdominal distention or other pain Genitourinary: Negative for hematuria, flank pain or change in urine volume.  Musculoskeletal: Negative for myalgias and joint swelling.  Skin: Negative for color change and wound.  Neurological: Negative for syncope and numbness. other than noted Hematological: Negative for adenopathy.  Psychiatric/Behavioral: Negative for hallucinations, self-injury, decreased concentration and agitation.      Objective:   Physical Exam BP 112/80  Pulse 62  Temp(Src) 97.4 F (36.3 C) (Oral)  Ht 5\' 6"  (1.676 m)  Wt 188 lb 2 oz (85.333 kg)  BMI 30.38 kg/m2  SpO2 95% VS noted,  Constitutional: Pt is oriented to person, place, and time. Appears well-developed and well-nourished.  Head: Normocephalic and atraumatic.  Right Ear: External ear normal.  Left Ear: External ear normal.  Nose: Nose normal.  Mouth/Throat: Oropharynx is clear and moist.  Eyes: Conjunctivae and EOM are normal. Pupils are equal, round, and reactive to light.  Neck: Normal range of motion. Neck supple. No JVD present. No tracheal deviation present.  Cardiovascular: Normal rate, regular rhythm, normal heart sounds and intact distal pulses.   Pulmonary/Chest: Effort normal and breath sounds normal.  Abdominal: Soft. Bowel sounds are normal. There is no tenderness. No HSM  Musculoskeletal: Normal range of motion. Exhibits no edema.  Lymphadenopathy:  Has no cervical adenopathy.  Neurological: Pt is alert and oriented to person, place, and time. Pt has normal reflexes. No cranial nerve deficit.  Skin: Skin is warm and  dry. No rash noted.except for diffuse facial erythema mostly cheeks and ? Mild scalines to forehead  Psychiatric:  Has  normal mood and affect. Behavior is normal.         Assessment & Plan:

## 2013-01-26 NOTE — Telephone Encounter (Signed)
Message copied by Pincus Sanes on Wed Jan 26, 2013 11:53 AM ------      Message from: Corwin Levins      Created: Wed Jan 26, 2013 11:46 AM       Really not due until 2015 (last was 2010)      ----- Message -----         From: Vladimir Crofts Ewing         Sent: 01/26/2013   9:16 AM           To: Corwin Levins, MD            The patient forgot to ask if he is due a pneumonia shot.  If so he can schedule nurse visit on the day he returns to check his PSA in the lab.  Please advise       ------

## 2013-01-26 NOTE — Assessment & Plan Note (Signed)

## 2013-01-26 NOTE — Telephone Encounter (Signed)
Patient informed. 

## 2013-03-16 ENCOUNTER — Other Ambulatory Visit: Payer: Self-pay

## 2013-06-16 ENCOUNTER — Other Ambulatory Visit: Payer: Self-pay

## 2013-06-29 ENCOUNTER — Telehealth: Payer: Self-pay

## 2013-06-29 MED ORDER — ENALAPRIL MALEATE 2.5 MG PO TABS: 2.5000 mg | ORAL_TABLET | Freq: Every day | ORAL | Status: DC

## 2013-06-29 MED ORDER — METOPROLOL TARTRATE 25 MG PO TABS: 25.0000 mg | ORAL_TABLET | Freq: Two times a day (BID) | ORAL | Status: DC

## 2013-06-29 NOTE — Telephone Encounter (Signed)
refill 

## 2013-07-26 ENCOUNTER — Ambulatory Visit: Payer: Medicare Other | Admitting: Internal Medicine

## 2013-07-28 ENCOUNTER — Ambulatory Visit: Payer: Medicare Other | Admitting: Internal Medicine

## 2014-01-11 ENCOUNTER — Telehealth: Payer: Self-pay

## 2014-01-11 DIAGNOSIS — Z0000 Encounter for general adult medical examination without abnormal findings: Secondary | ICD-10-CM

## 2014-01-11 NOTE — Telephone Encounter (Signed)
Unfortunately I cannot tell from the EMR if he has insurance other than medicare  If has traditional medicare, we cannot order lab work prior   If has other insurance, we most likely could

## 2014-01-11 NOTE — Telephone Encounter (Signed)
Message copied by Jamesetta Orleans on Wed Jan 11, 2014 12:06 PM ------      Message from: Shawnie Pons      Created: Wed Jan 11, 2014 10:46 AM       Pt has cpe appt on 01/27/14, pt request lab work prior. Please call pt if this is ok.             Thanks! ------

## 2014-01-11 NOTE — Telephone Encounter (Signed)
Patient has [facility] informed ok to go to the lab.

## 2014-01-11 NOTE — Telephone Encounter (Signed)
advise

## 2014-01-11 NOTE — Telephone Encounter (Signed)
Labs ordered.

## 2014-01-13 ENCOUNTER — Other Ambulatory Visit: Payer: Self-pay | Admitting: Internal Medicine

## 2014-01-20 ENCOUNTER — Other Ambulatory Visit (INDEPENDENT_AMBULATORY_CARE_PROVIDER_SITE_OTHER): Payer: Medicare Other

## 2014-01-20 ENCOUNTER — Ambulatory Visit: Payer: Medicare Other

## 2014-01-20 DIAGNOSIS — E1165 Type 2 diabetes mellitus with hyperglycemia: Principal | ICD-10-CM

## 2014-01-20 DIAGNOSIS — Z0000 Encounter for general adult medical examination without abnormal findings: Secondary | ICD-10-CM

## 2014-01-20 DIAGNOSIS — E119 Type 2 diabetes mellitus without complications: Secondary | ICD-10-CM

## 2014-01-20 DIAGNOSIS — Z125 Encounter for screening for malignant neoplasm of prostate: Secondary | ICD-10-CM

## 2014-01-20 DIAGNOSIS — IMO0001 Reserved for inherently not codable concepts without codable children: Secondary | ICD-10-CM

## 2014-01-20 LAB — URINALYSIS, ROUTINE W REFLEX MICROSCOPIC
Bilirubin Urine: NEGATIVE
Hgb urine dipstick: NEGATIVE
Ketones, ur: NEGATIVE
Leukocytes, UA: NEGATIVE
Nitrite: NEGATIVE
PH: 5.5 (ref 5.0–8.0)
SPECIFIC GRAVITY, URINE: 1.025 (ref 1.000–1.030)
TOTAL PROTEIN, URINE-UPE24: NEGATIVE
URINE GLUCOSE: NEGATIVE
Urobilinogen, UA: 0.2 (ref 0.0–1.0)

## 2014-01-20 LAB — CBC WITH DIFFERENTIAL/PLATELET
BASOS PCT: 0.3 % (ref 0.0–3.0)
Basophils Absolute: 0 10*3/uL (ref 0.0–0.1)
Eosinophils Absolute: 0.1 10*3/uL (ref 0.0–0.7)
Eosinophils Relative: 1.4 % (ref 0.0–5.0)
HCT: 40.2 % (ref 39.0–52.0)
HEMOGLOBIN: 13.7 g/dL (ref 13.0–17.0)
LYMPHS PCT: 26.2 % (ref 12.0–46.0)
Lymphs Abs: 2.6 10*3/uL (ref 0.7–4.0)
MCHC: 34.2 g/dL (ref 30.0–36.0)
MCV: 91.1 fl (ref 78.0–100.0)
MONO ABS: 0.6 10*3/uL (ref 0.1–1.0)
Monocytes Relative: 5.8 % (ref 3.0–12.0)
NEUTROS ABS: 6.6 10*3/uL (ref 1.4–7.7)
Neutrophils Relative %: 66.3 % (ref 43.0–77.0)
Platelets: 223 10*3/uL (ref 150.0–400.0)
RBC: 4.41 Mil/uL (ref 4.22–5.81)
RDW: 14.7 % (ref 11.5–15.5)
WBC: 10 10*3/uL (ref 4.0–10.5)

## 2014-01-20 LAB — HEPATIC FUNCTION PANEL
ALK PHOS: 72 U/L (ref 39–117)
ALT: 24 U/L (ref 0–53)
AST: 24 U/L (ref 0–37)
Albumin: 3.9 g/dL (ref 3.5–5.2)
BILIRUBIN DIRECT: 0.1 mg/dL (ref 0.0–0.3)
BILIRUBIN TOTAL: 0.7 mg/dL (ref 0.2–1.2)
Total Protein: 7 g/dL (ref 6.0–8.3)

## 2014-01-20 LAB — LIPID PANEL
CHOL/HDL RATIO: 4
Cholesterol: 145 mg/dL (ref 0–200)
HDL: 41 mg/dL (ref >39.00)
LDL Cholesterol: 70 mg/dL (ref 0–99)
NonHDL: 104
Triglycerides: 169 mg/dL — ABNORMAL HIGH (ref 0.0–149.0)
VLDL: 33.8 mg/dL (ref 0.0–40.0)

## 2014-01-20 LAB — BASIC METABOLIC PANEL
BUN: 19 mg/dL (ref 6–23)
CO2: 25 mEq/L (ref 19–32)
Calcium: 9.1 mg/dL (ref 8.4–10.5)
Chloride: 103 mEq/L (ref 96–112)
Creatinine, Ser: 0.8 mg/dL (ref 0.4–1.5)
GFR: 105.18 mL/min (ref >60.00)
GLUCOSE: 163 mg/dL — AB (ref 70–99)
POTASSIUM: 4.5 meq/L (ref 3.5–5.1)
Sodium: 137 mEq/L (ref 135–145)

## 2014-01-20 LAB — PSA: PSA: 9.03 ng/mL — AB (ref 0.10–4.00)

## 2014-01-20 LAB — TSH: TSH: 1.4 u[IU]/mL (ref 0.35–4.50)

## 2014-01-20 LAB — HEMOGLOBIN A1C: HEMOGLOBIN A1C: 7.5 % — AB (ref 4.6–6.5)

## 2014-01-27 ENCOUNTER — Encounter: Payer: Self-pay | Admitting: Internal Medicine

## 2014-01-27 ENCOUNTER — Ambulatory Visit (INDEPENDENT_AMBULATORY_CARE_PROVIDER_SITE_OTHER): Payer: Medicare Other | Admitting: Internal Medicine

## 2014-01-27 VITALS — BP 130/80 | HR 90 | Temp 98.2°F | Ht 66.0 in | Wt 187.1 lb

## 2014-01-27 DIAGNOSIS — L719 Rosacea, unspecified: Secondary | ICD-10-CM

## 2014-01-27 DIAGNOSIS — Z0000 Encounter for general adult medical examination without abnormal findings: Secondary | ICD-10-CM

## 2014-01-27 DIAGNOSIS — R972 Elevated prostate specific antigen [PSA]: Secondary | ICD-10-CM

## 2014-01-27 DIAGNOSIS — IMO0001 Reserved for inherently not codable concepts without codable children: Secondary | ICD-10-CM

## 2014-01-27 DIAGNOSIS — E1165 Type 2 diabetes mellitus with hyperglycemia: Secondary | ICD-10-CM

## 2014-01-27 DIAGNOSIS — E119 Type 2 diabetes mellitus without complications: Secondary | ICD-10-CM

## 2014-01-27 MED ORDER — ENALAPRIL MALEATE 2.5 MG PO TABS: 2.5000 mg | ORAL_TABLET | Freq: Every day | ORAL | Status: DC

## 2014-01-27 MED ORDER — METOPROLOL TARTRATE 25 MG PO TABS
25.0000 mg | ORAL_TABLET | Freq: Two times a day (BID) | ORAL | Status: DC
Start: 2014-01-27 — End: 2015-03-01

## 2014-01-27 MED ORDER — ATORVASTATIN CALCIUM 10 MG PO TABS: 10.0000 mg | ORAL_TABLET | Freq: Every day | ORAL | Status: DC

## 2014-01-27 MED ORDER — METFORMIN HCL ER 500 MG PO TB24: 500.0000 mg | ORAL_TABLET | Freq: Every day | ORAL | Status: DC

## 2014-01-27 NOTE — Progress Notes (Signed)
Pre visit review using our clinic review tool, if applicable. No additional management support is needed unless otherwise documented below in the visit note. 

## 2014-01-27 NOTE — Progress Notes (Signed)
Subjective:    Patient ID: Steve Collins, male    DOB: 07-Feb-1946, 68 y.o.   MRN: [id number]  HPI    Here for wellness and f/u;  Overall doing ok;  Pt denies CP, worsening SOB, DOE, wheezing, orthopnea, PND, worsening LE edema, palpitations, dizziness or syncope.  Pt denies neurological change such as new headache, facial or extremity weakness.  Pt denies polydipsia, polyuria, or low sugar symptoms. Pt states overall good compliance with treatment and medications, good tolerability, and has been trying to follow lower cholesterol diet.  Pt denies worsening depressive symptoms, suicidal ideation or panic. No fever, night sweats, wt loss, loss of appetite, or other constitutional symptoms.  Pt states good ability with ADL's, has low fall risk, home safety reviewed and adequate, no other significant changes in hearing or vision, and only occasionally active with exercise.  S/p bilat corneal transplants, the most recent was the right.  [place] a [place], hit head near the left eye, f/u with optho c/w detached retina, had urgent surgury nov 2014, with tx, but later detached again, with a second procedure to repair last month, to f/u aug 2015, at [facility].  Not seeing urology currently.   Past Medical History  Diagnosis Date  . CATARACT NOS 06/08/2007  . COLONIC POLYPS, HX OF 08/17/2008  . DIABETES MELLITUS, TYPE II 10/10/2009  . DIVERTICULOSIS, COLON 08/17/2008  . ERECTILE DYSFUNCTION 06/30/2007  . GERD 06/30/2007  . GOUT 07/09/2010  . HAND PAIN, RIGHT 04/22/2010  . HYPERLIPIDEMIA 06/30/2007  . HYPERTENSION 08/17/2008  . LEUKOCYTOSIS 10/16/2010  . Myalgia 11/12/2010  . OSTEOARTHRITIS, KNEE, LEFT 06/30/2007  . Pain in joint, multiple sites 10/16/2010  . SHOULDER PAIN, BILATERAL 07/09/2010  . WRIST PAIN, RIGHT 07/09/2010  . PMR (polymyalgia rheumatica) 05/14/2011  . Coronary artery calcification seen on CAT scan 07/27/2012   Past Surgical History  Procedure Laterality Date  .  Cataract extraction    . Inguinal herniorrhapy    . S.p right knee arthroscopic      surgury in his 45's    reports that he has quit smoking. He does not have any smokeless tobacco history on file. He reports that he drinks alcohol. He reports that he does not use illicit drugs. family history includes Dementia in his father and mother; Diabetes in his other; Gout in his brother and maternal grandfather; Parkinsonism in his other. No Known Allergies Current Outpatient Prescriptions on File Prior to Visit  Medication Sig Dispense Refill  . aspirin 81 MG tablet Take 81 mg by mouth daily.        . dorzolamide-timolol (COSOPT) 22.3-6.8 MG/ML ophthalmic solution Place 1 drop into the right eye 2 (two) times daily.      . metroNIDAZOLE (METROGEL) 1 % gel Apply topically daily.  45 g  1  . omeprazole (PRILOSEC) 20 MG capsule Take 1 capsule (20 mg total) by mouth daily.  90 capsule  3  . prednisoLONE acetate (PRED FORTE) 1 % ophthalmic suspension       . tadalafil (CIALIS) 20 MG tablet Take 1 tablet (20 mg total) by mouth daily as needed for erectile dysfunction.  5 tablet  6   No current facility-administered medications on file prior to visit.   Review of Systems Constitutional: Negative for increased diaphoresis, other activity, appetite or other siginficant weight change  HENT: Negative for worsening hearing loss, ear pain, facial swelling, mouth sores and neck stiffness.   Eyes:  Negative for other worsening pain, redness or visual disturbance.  Respiratory: Negative for shortness of breath and wheezing.   Cardiovascular: Negative for chest pain and palpitations.  Gastrointestinal: Negative for diarrhea, blood in stool, abdominal distention or other pain Genitourinary: Negative for hematuria, flank pain or change in urine volume.  Musculoskeletal: Negative for myalgias or other joint complaints.  Skin: Negative for color change and wound.  Neurological: Negative for syncope and numbness.  other than noted Hematological: Negative for adenopathy. or other swelling Psychiatric/Behavioral: Negative for hallucinations, self-injury, decreased concentration or other worsening agitation.      Objective:   Physical Exam BP 130/80  Pulse 90  Temp(Src) 98.2 F (36.8 C) (Oral)  Ht 5\' 6"  (1.676 m)  Wt 187 lb 2 oz (84.879 kg)  BMI 30.22 kg/m2  SpO2 92% VS noted,  Constitutional: Pt is oriented to person, place, and time. Appears well-developed and well-nourished.  Head: Normocephalic and atraumatic.  Right Ear: External ear normal.  Left Ear: External ear normal.  Nose: Nose normal.  Mouth/Throat: Oropharynx is clear and moist.  Eyes: Conjunctivae and EOM are normal. Pupils are equal, round, and reactive to light.  Neck: Normal range of motion. Neck supple. No JVD present. No tracheal deviation present.  Cardiovascular: Normal rate, regular rhythm, normal heart sounds and intact distal pulses.   Pulmonary/Chest: Effort normal and breath sounds without rales or wheezing  Abdominal: Soft. Bowel sounds are normal. NT. No HSM  Musculoskeletal: Normal range of motion. Exhibits no edema.  Lymphadenopathy:  Has no cervical adenopathy.  Neurological: Pt is alert and oriented to person, place, and time. Pt has normal reflexes. No cranial nerve deficit. Motor grossly intact Skin: Skin is warm and dry. No rash noted.  except for patchy erythema nontender to facies Psychiatric:  Has normal mood and affect. Behavior is normal.     Assessment & Plan:

## 2014-01-27 NOTE — Patient Instructions (Addendum)
You had the new Prevnar pneumonia shot probably in November 2014, but you should check with the [facility] to make sure; if not, please make Nurse Appt to have this done  Your lab work was [facility] will be contacted regarding the referral for: urology, and dermatology  Please continue all other medications as before, and refills have been done if requested.  Please have the pharmacy call with any other refills you may need.  Please continue your efforts at being more active, low cholesterol diet, and weight control.  You are otherwise up to date with prevention measures today.  Please keep your appointments with your specialists as you may have planned - opthamology in August  Please return in 6 months, or sooner if needed, with Lab testing done 3-5 days before

## 2014-01-28 NOTE — Assessment & Plan Note (Signed)
Worsening recent, for derm referral

## 2014-01-28 NOTE — Assessment & Plan Note (Signed)
stable overall by history and exam, recent data reviewed with pt, and pt to continue medical treatment as before,  to f/u any worsening symptoms or concerns Lab Results  Component Value Date   HGBA1C 7.5* 01/20/2014

## 2014-01-28 NOTE — Assessment & Plan Note (Signed)
New for him, for urology referral Lab Results  Component Value Date   PSA 9.03* 01/20/2014   PSA 6.49* 01/20/2013   PSA 3.99 01/21/2012

## 2014-01-28 NOTE — Assessment & Plan Note (Signed)

## 2014-05-08 ENCOUNTER — Telehealth: Payer: Self-pay | Admitting: Internal Medicine

## 2014-05-08 NOTE — Telephone Encounter (Signed)
Patient states he does not need PSA included in future labs b.c he is monitored by a urologist for this.

## 2014-05-09 NOTE — Telephone Encounter (Signed)
There is no order for PSA with next labs, on check of the "order review"   thanks

## 2014-07-12 ENCOUNTER — Telehealth: Payer: Self-pay | Admitting: Internal Medicine

## 2014-07-12 NOTE — Telephone Encounter (Signed)
Patient cancelled appointment for 6 month fu for this month.  He scheduled CPE for June.  He states he does not need PSA at that time because [facility] is following.

## 2014-07-12 NOTE — Telephone Encounter (Signed)
I assume this note means he does not need PSA at 6 mo visit as well as the 1 yr visit.,  Pt should re-schedule the 6 mo appt though, if he is willing,  as he is due for his 6 mo a1c and related labs

## 2014-07-12 NOTE — Telephone Encounter (Signed)
Patient states he will look at his schedule and call back to make 6 month ov

## 2014-07-18 ENCOUNTER — Other Ambulatory Visit (INDEPENDENT_AMBULATORY_CARE_PROVIDER_SITE_OTHER): Payer: Medicare Other

## 2014-07-18 DIAGNOSIS — IMO0002 Reserved for concepts with insufficient information to code with codable children: Secondary | ICD-10-CM

## 2014-07-18 DIAGNOSIS — E1165 Type 2 diabetes mellitus with hyperglycemia: Secondary | ICD-10-CM

## 2014-07-18 LAB — HEPATIC FUNCTION PANEL
ALBUMIN: 4.2 g/dL (ref 3.5–5.2)
ALT: 23 U/L (ref 0–53)
AST: 21 U/L (ref 0–37)
Alkaline Phosphatase: 79 U/L (ref 39–117)
BILIRUBIN TOTAL: 0.9 mg/dL (ref 0.2–1.2)
Bilirubin, Direct: 0.1 mg/dL (ref 0.0–0.3)
Total Protein: 7.4 g/dL (ref 6.0–8.3)

## 2014-07-18 LAB — LIPID PANEL
CHOLESTEROL: 138 mg/dL (ref 0–200)
HDL: 41.4 mg/dL (ref >39.00)
LDL CALC: 71 mg/dL (ref 0–99)
NonHDL: 96.6
Total CHOL/HDL Ratio: 3
Triglycerides: 129 mg/dL (ref 0.0–149.0)
VLDL: 25.8 mg/dL (ref 0.0–40.0)

## 2014-07-18 LAB — BASIC METABOLIC PANEL
BUN: 22 mg/dL (ref 6–23)
CALCIUM: 9.2 mg/dL (ref 8.4–10.5)
CO2: 27 mEq/L (ref 19–32)
Chloride: 99 mEq/L (ref 96–112)
Creatinine, Ser: 1 mg/dL (ref 0.4–1.5)
GFR: 83.65 mL/min (ref >60.00)
GLUCOSE: 158 mg/dL — AB (ref 70–99)
Potassium: 4.6 mEq/L (ref 3.5–5.1)
Sodium: 134 mEq/L — ABNORMAL LOW (ref 135–145)

## 2014-07-18 LAB — HEMOGLOBIN A1C: Hgb A1c MFr Bld: 7.3 % — ABNORMAL HIGH (ref 4.6–6.5)

## 2014-07-25 ENCOUNTER — Encounter: Payer: Self-pay | Admitting: Internal Medicine

## 2014-07-25 ENCOUNTER — Ambulatory Visit (INDEPENDENT_AMBULATORY_CARE_PROVIDER_SITE_OTHER): Payer: Medicare Other | Admitting: Internal Medicine

## 2014-07-25 VITALS — BP 140/92 | HR 64 | Temp 97.6°F | Ht 66.0 in | Wt 187.5 lb

## 2014-07-25 DIAGNOSIS — E785 Hyperlipidemia, unspecified: Secondary | ICD-10-CM

## 2014-07-25 DIAGNOSIS — I10 Essential (primary) hypertension: Secondary | ICD-10-CM

## 2014-07-25 DIAGNOSIS — Z0000 Encounter for general adult medical examination without abnormal findings: Secondary | ICD-10-CM

## 2014-07-25 DIAGNOSIS — E119 Type 2 diabetes mellitus without complications: Secondary | ICD-10-CM

## 2014-07-25 DIAGNOSIS — Z0189 Encounter for other specified special examinations: Secondary | ICD-10-CM

## 2014-07-25 MED ORDER — LOSARTAN POTASSIUM 50 MG PO TABS
50.0000 mg | ORAL_TABLET | Freq: Every day | ORAL | Status: DC
Start: 2014-07-25 — End: 2015-07-09

## 2014-07-25 MED ORDER — METFORMIN HCL ER 500 MG PO TB24: ORAL_TABLET | ORAL | Status: DC

## 2014-07-25 NOTE — Assessment & Plan Note (Signed)
stable overall by history and exam, recent data reviewed with pt, and pt to continue medical treatment as before,  to f/u any worsening symptoms or concerns . Lab Results  Component Value Date   LDLCALC 71 07/18/2014

## 2014-07-25 NOTE — Patient Instructions (Signed)
Please take all new medication as prescribed - the losartan at 50 mg per day (for blood pressure and kidneys) - OK to use up the enalapril at 2.5 mg twice per day before then  OK to increase the metformin to 2 pills per day  Please continue all other medications as before, and refills have been done if requested.  Please have the pharmacy call with any other refills you may need.  Please continue your efforts at being more active, low cholesterol diabetic diet, and weight control.  Please return in 6 months, or sooner if needed, with Lab testing done 3-5 days before

## 2014-07-25 NOTE — Progress Notes (Signed)
Subjective:    Patient ID: Steve Collins, male    DOB: 16-Jul-1946, 68 y.o.   MRN: [id number]  HPI  Here to f/u; overall doing ok,  Pt denies chest pain, increased sob or doe, wheezing, orthopnea, PND, increased LE swelling, palpitations, dizziness or syncope.  Pt denies polydipsia, polyuria, or low sugar symptoms such as weakness or confusion improved with po intake.  Pt denies new neurological symptoms such as new headache, or facial or extremity weakness or numbness.   Pt states overall good compliance with meds, has been trying to follow lower cholesterol, diabetic diet, with wt overall stable,  but little exercise however.   Seen recently per derm for acute acne, facial skin improved with topical txc Past Medical History  Diagnosis Date  . CATARACT NOS 06/08/2007  . COLONIC POLYPS, HX OF 08/17/2008  . DIABETES MELLITUS, TYPE II 10/10/2009  . DIVERTICULOSIS, COLON 08/17/2008  . ERECTILE DYSFUNCTION 06/30/2007  . GERD 06/30/2007  . GOUT 07/09/2010  . HAND PAIN, RIGHT 04/22/2010  . HYPERLIPIDEMIA 06/30/2007  . HYPERTENSION 08/17/2008  . LEUKOCYTOSIS 10/16/2010  . Myalgia 11/12/2010  . OSTEOARTHRITIS, KNEE, LEFT 06/30/2007  . Pain in joint, multiple sites 10/16/2010  . SHOULDER PAIN, BILATERAL 07/09/2010  . WRIST PAIN, RIGHT 07/09/2010  . PMR (polymyalgia rheumatica) 05/14/2011  . Coronary artery calcification seen on CAT scan 07/27/2012   Past Surgical History  Procedure Laterality Date  . Cataract extraction    . Inguinal herniorrhapy    . S.p right knee arthroscopic      surgury in his 56's    reports that he has quit smoking. He does not have any smokeless tobacco history on file. He reports that he drinks alcohol. He reports that he does not use illicit drugs. family history includes Dementia in his father and mother; Diabetes in his other; Gout in his brother and maternal grandfather; Parkinsonism in his other. No Known Allergies Current Outpatient Prescriptions on File Prior to Visit    Medication Sig Dispense Refill  . aspirin 81 MG tablet Take 81 mg by mouth daily.      Marland Kitchen atorvastatin (LIPITOR) 10 MG tablet Take 1 tablet (10 mg total) by mouth daily at 6 PM. 90 tablet 3  . dorzolamide-timolol (COSOPT) 22.3-6.8 MG/ML ophthalmic solution Place 1 drop into the right eye 2 (two) times daily.    . enalapril (VASOTEC) 2.5 MG tablet Take 1 tablet (2.5 mg total) by mouth daily. 90 tablet 3  . metoprolol tartrate (LOPRESSOR) 25 MG tablet Take 1 tablet (25 mg total) by mouth 2 (two) times daily. 180 tablet 3  . metroNIDAZOLE (METROGEL) 1 % gel Apply topically daily. 45 g 1  . omeprazole (PRILOSEC) 20 MG capsule Take 1 capsule (20 mg total) by mouth daily. 90 capsule 3  . tadalafil (CIALIS) 20 MG tablet Take 1 tablet (20 mg total) by mouth daily as needed for erectile dysfunction. 5 tablet 6   No current facility-administered medications on file prior to visit.   Review of Systems  Constitutional: Negative for unusual diaphoresis or other sweats  HENT: Negative for ringing in ear Eyes: Negative for double vision or worsening visual disturbance.  Respiratory: Negative for choking and stridor.   Gastrointestinal: Negative for vomiting or other signifcant bowel change Genitourinary: Negative for hematuria or decreased urine volume.  Musculoskeletal: Negative for other MSK pain or swelling Skin: Negative for color change and worsening wound.  Neurological: Negative for tremors and numbness other than noted  Psychiatric/Behavioral: Negative for  decreased concentration or agitation other than above       Objective:   Physical Exam BP 140/92 mmHg  Pulse 64  Temp(Src) 97.6 F (36.4 C) (Oral)  Ht 5\' 6"  (1.676 m)  Wt 187 lb 8 oz (85.049 kg)  BMI 30.28 kg/m2  SpO2 94% VS noted,  Constitutional: Pt appears well-developed, well-nourished.  HENT: Head: NCAT.  Right Ear: External ear normal.  Left Ear: External ear normal.  Eyes: . Pupils are equal, round, and reactive to light.  Conjunctivae and EOM are normal Neck: Normal range of motion. Neck supple.  Cardiovascular: Normal rate and regular rhythm.   Pulmonary/Chest: Effort normal and breath sounds normal.  Neurological: Pt is alert. Not confused , motor grossly intact Skin: Skin is warm. No rash Psychiatric: Pt behavior is normal. No agitation.     Assessment & Plan:

## 2014-07-25 NOTE — Assessment & Plan Note (Signed)
Mild uncontrolled, to incr the metformin to 2 qd, cont diet , exercise and wt loss efforts

## 2014-07-25 NOTE — Progress Notes (Signed)
Pre visit review using our clinic review tool, if applicable. No additional management support is needed unless otherwise documented below in the visit note. 

## 2014-07-25 NOTE — Assessment & Plan Note (Signed)
Mild uncontrolled, for change enalapril to losartan 50 qd, f/u bp at home and next visit

## 2014-07-26 ENCOUNTER — Telehealth: Payer: Self-pay | Admitting: Internal Medicine

## 2014-07-26 NOTE — Telephone Encounter (Signed)
emmi emailed °

## 2014-07-28 ENCOUNTER — Ambulatory Visit: Payer: Medicare Other | Admitting: Internal Medicine

## 2015-01-24 ENCOUNTER — Other Ambulatory Visit (INDEPENDENT_AMBULATORY_CARE_PROVIDER_SITE_OTHER): Payer: Self-pay

## 2015-01-24 DIAGNOSIS — Z0000 Encounter for general adult medical examination without abnormal findings: Secondary | ICD-10-CM

## 2015-01-24 DIAGNOSIS — Z0189 Encounter for other specified special examinations: Secondary | ICD-10-CM

## 2015-01-24 DIAGNOSIS — E119 Type 2 diabetes mellitus without complications: Secondary | ICD-10-CM

## 2015-01-24 LAB — CBC WITH DIFFERENTIAL/PLATELET
BASOS ABS: 0 10*3/uL (ref 0.0–0.1)
Basophils Relative: 0.3 % (ref 0.0–3.0)
EOS PCT: 1.1 % (ref 0.0–5.0)
Eosinophils Absolute: 0.1 10*3/uL (ref 0.0–0.7)
HCT: 39.4 % (ref 39.0–52.0)
Hemoglobin: 13 g/dL (ref 13.0–17.0)
LYMPHS ABS: 2.8 10*3/uL (ref 0.7–4.0)
LYMPHS PCT: 29.5 % (ref 12.0–46.0)
MCHC: 32.9 g/dL (ref 30.0–36.0)
MCV: 90.8 fl (ref 78.0–100.0)
MONOS PCT: 6.3 % (ref 3.0–12.0)
Monocytes Absolute: 0.6 10*3/uL (ref 0.1–1.0)
NEUTROS ABS: 5.9 10*3/uL (ref 1.4–7.7)
Neutrophils Relative %: 62.8 % (ref 43.0–77.0)
Platelets: 201 10*3/uL (ref 150.0–400.0)
RBC: 4.34 Mil/uL (ref 4.22–5.81)
RDW: 15.6 % — ABNORMAL HIGH (ref 11.5–15.5)
WBC: 9.4 10*3/uL (ref 4.0–10.5)

## 2015-01-24 LAB — URINALYSIS, ROUTINE W REFLEX MICROSCOPIC
Bilirubin Urine: NEGATIVE
HGB URINE DIPSTICK: NEGATIVE
Ketones, ur: NEGATIVE
Leukocytes, UA: NEGATIVE
Nitrite: NEGATIVE
PH: 5.5 (ref 5.0–8.0)
RBC / HPF: NONE SEEN (ref 0–?)
Specific Gravity, Urine: >=1.03 — AB (ref 1.000–1.030)
TOTAL PROTEIN, URINE-UPE24: NEGATIVE
URINE GLUCOSE: NEGATIVE
Urobilinogen, UA: 0.2 (ref 0.0–1.0)
WBC UA: NONE SEEN (ref 0–?)

## 2015-01-24 LAB — HEPATIC FUNCTION PANEL
ALT: 19 U/L (ref 0–53)
AST: 18 U/L (ref 0–37)
Albumin: 4.2 g/dL (ref 3.5–5.2)
Alkaline Phosphatase: 69 U/L (ref 39–117)
BILIRUBIN DIRECT: 0.1 mg/dL (ref 0.0–0.3)
TOTAL PROTEIN: 7 g/dL (ref 6.0–8.3)
Total Bilirubin: 0.5 mg/dL (ref 0.2–1.2)

## 2015-01-24 LAB — BASIC METABOLIC PANEL
BUN: 26 mg/dL — AB (ref 6–23)
CO2: 28 mEq/L (ref 19–32)
CREATININE: 0.93 mg/dL (ref 0.40–1.50)
Calcium: 9.4 mg/dL (ref 8.4–10.5)
Chloride: 103 mEq/L (ref 96–112)
GFR: 85.6 mL/min (ref >60.00)
Glucose, Bld: 141 mg/dL — ABNORMAL HIGH (ref 70–99)
Potassium: 4.3 mEq/L (ref 3.5–5.1)
Sodium: 138 mEq/L (ref 135–145)

## 2015-01-24 LAB — HEMOGLOBIN A1C: Hgb A1c MFr Bld: 6.7 % — ABNORMAL HIGH (ref 4.6–6.5)

## 2015-01-24 LAB — MICROALBUMIN / CREATININE URINE RATIO
CREATININE, U: 191.4 mg/dL
MICROALB/CREAT RATIO: 0.5 mg/g (ref 0.0–30.0)
Microalb, Ur: 0.9 mg/dL (ref 0.0–1.9)

## 2015-01-24 LAB — LIPID PANEL
Cholesterol: 122 mg/dL (ref 0–200)
HDL: 42 mg/dL (ref >39.00)
LDL Cholesterol: 61 mg/dL (ref 0–99)
NonHDL: 80
Total CHOL/HDL Ratio: 3
Triglycerides: 97 mg/dL (ref 0.0–149.0)
VLDL: 19.4 mg/dL (ref 0.0–40.0)

## 2015-01-24 LAB — TSH: TSH: 1.14 u[IU]/mL (ref 0.35–4.50)

## 2015-01-31 ENCOUNTER — Encounter: Payer: Medicare Other | Admitting: Internal Medicine

## 2015-02-08 ENCOUNTER — Encounter: Payer: Self-pay | Admitting: Internal Medicine

## 2015-02-08 ENCOUNTER — Ambulatory Visit (INDEPENDENT_AMBULATORY_CARE_PROVIDER_SITE_OTHER): Payer: Medicare Other | Admitting: Internal Medicine

## 2015-02-08 VITALS — BP 128/76 | HR 63 | Temp 97.5°F | Ht 66.0 in | Wt 187.0 lb

## 2015-02-08 DIAGNOSIS — E119 Type 2 diabetes mellitus without complications: Secondary | ICD-10-CM | POA: Diagnosis not present

## 2015-02-08 DIAGNOSIS — E785 Hyperlipidemia, unspecified: Secondary | ICD-10-CM | POA: Diagnosis not present

## 2015-02-08 DIAGNOSIS — I10 Essential (primary) hypertension: Secondary | ICD-10-CM

## 2015-02-08 DIAGNOSIS — Z0000 Encounter for general adult medical examination without abnormal findings: Secondary | ICD-10-CM | POA: Diagnosis not present

## 2015-02-08 NOTE — Assessment & Plan Note (Signed)
stable overall by history and exam, recent data reviewed with pt, and pt to continue medical treatment as before,  to f/u any worsening symptoms or concerns Lab Results  Component Value Date   LDLCALC 61 01/24/2015

## 2015-02-08 NOTE — Progress Notes (Signed)
Pre visit review using our clinic review tool, if applicable. No additional management support is needed unless otherwise documented below in the visit note. 

## 2015-02-08 NOTE — Assessment & Plan Note (Addendum)

## 2015-02-08 NOTE — Assessment & Plan Note (Signed)
stable overall by history and exam, recent data reviewed with pt, and pt to continue medical treatment as before,  to f/u any worsening symptoms or concerns Lab Results  Component Value Date   HGBA1C 6.7* 01/24/2015

## 2015-02-08 NOTE — Assessment & Plan Note (Signed)
stable overall by history and exam, recent data reviewed with pt, and pt to continue medical treatment as before,  to f/u any worsening symptoms or concerns BP Readings from Last 3 Encounters:  02/08/15 128/76  07/25/14 140/92  01/27/14 130/80

## 2015-02-08 NOTE — Progress Notes (Signed)
Subjective:    Patient ID: Steve Collins, male    DOB: Apr 13, 1946, 70 y.o.   MRN: [id number]  HPI  Here for wellness and f/u;  Overall doing ok;  Pt denies Chest pain, worsening SOB, DOE, wheezing, orthopnea, PND, worsening LE edema, palpitations, dizziness or syncope.  Pt denies neurological change such as new headache, facial or extremity weakness.  Pt denies polydipsia, polyuria, or low sugar symptoms. Pt states overall good compliance with treatment and medications, good tolerability, and has been trying to follow appropriate diet.  Pt denies worsening depressive symptoms, suicidal ideation or panic. No fever, night sweats, wt loss, loss of appetite, or other constitutional symptoms.  Pt states good ability with ADL's, has low fall risk, home safety reviewed and adequate, no other significant changes in hearing or vision, and only occasionally active with exercise.  Due for colonoscopy but he is wary of the costs afer unexpected costs with the last exam 2004 Past Medical History  Diagnosis Date  . CATARACT NOS 06/08/2007  . COLONIC POLYPS, HX OF 08/17/2008  . DIABETES MELLITUS, TYPE II 10/10/2009  . DIVERTICULOSIS, COLON 08/17/2008  . ERECTILE DYSFUNCTION 06/30/2007  . GERD 06/30/2007  . GOUT 07/09/2010  . HAND PAIN, RIGHT 04/22/2010  . HYPERLIPIDEMIA 06/30/2007  . HYPERTENSION 08/17/2008  . LEUKOCYTOSIS 10/16/2010  . Myalgia 11/12/2010  . OSTEOARTHRITIS, KNEE, LEFT 06/30/2007  . Pain in joint, multiple sites 10/16/2010  . SHOULDER PAIN, BILATERAL 07/09/2010  . WRIST PAIN, RIGHT 07/09/2010  . PMR (polymyalgia rheumatica) 05/14/2011  . Coronary artery calcification seen on CAT scan 07/27/2012   Past Surgical History  Procedure Laterality Date  . Cataract extraction    . Inguinal herniorrhapy    . S.p right knee arthroscopic      surgury in his 60's    reports that he has quit smoking. He does not have any smokeless tobacco history on file. He reports that he drinks alcohol. He reports that he  does not use illicit drugs. family history includes Dementia in his father and mother; Diabetes in his other; Gout in his brother and maternal grandfather; Parkinsonism in his other. Allergies  Allergen Reactions  . Hydrocodone Nausea Only   Current Outpatient Prescriptions on File Prior to Visit  Medication Sig Dispense Refill  . aspirin 81 MG tablet Take 81 mg by mouth daily.      Marland Kitchen atorvastatin (LIPITOR) 10 MG tablet Take 1 tablet (10 mg total) by mouth daily at 6 PM. 90 tablet 3  . losartan (COZAAR) 50 MG tablet Take 1 tablet (50 mg total) by mouth daily. 90 tablet 3  . metFORMIN (GLUCOPHAGE-XR) 500 MG 24 hr tablet 2 tabs by mouth per day 180 tablet 3  . metoprolol tartrate (LOPRESSOR) 25 MG tablet Take 1 tablet (25 mg total) by mouth 2 (two) times daily. 180 tablet 3  . metroNIDAZOLE (METROGEL) 1 % gel Apply topically daily. 45 g 1  . omeprazole (PRILOSEC) 20 MG capsule Take 1 capsule (20 mg total) by mouth daily. 90 capsule 3  . tadalafil (CIALIS) 20 MG tablet Take 1 tablet (20 mg total) by mouth daily as needed for erectile dysfunction. 5 tablet 6  . dorzolamide-timolol (COSOPT) 22.3-6.8 MG/ML ophthalmic solution Place 1 drop into the right eye 2 (two) times daily.     No current facility-administered medications on file prior to visit.    Review of Systems Constitutional: Negative for increased diaphoresis, other activity, appetite or siginficant weight change other than noted HENT: Negative for  worsening hearing loss, ear pain, facial swelling, mouth sores and neck stiffness.   Eyes: Negative for other worsening pain, redness or visual disturbance.  Respiratory: Negative for shortness of breath and wheezing  Cardiovascular: Negative for chest pain and palpitations.  Gastrointestinal: Negative for diarrhea, blood in stool, abdominal distention or other pain Genitourinary: Negative for hematuria, flank pain or change in urine volume.  Musculoskeletal: Negative for myalgias or  other joint complaints.  Skin: Negative for color change and wound or drainage.  Neurological: Negative for syncope and numbness. other than noted Hematological: Negative for adenopathy. or other swelling Psychiatric/Behavioral: Negative for hallucinations, SI, self-injury, decreased concentration or other worsening agitation.      Objective:   Physical Exam BP 128/76 mmHg  Pulse 63  Temp(Src) 97.5 F (36.4 C) (Oral)  Ht 5\' 6"  (1.676 m)  Wt 187 lb (84.823 kg)  BMI 30.20 kg/m2  SpO2 97% VS noted,  Constitutional: Pt is oriented to person, place, and time. Appears well-developed and well-nourished, in no significant distress Head: Normocephalic and atraumatic.  Right Ear: External ear normal.  Left Ear: External ear normal.  Nose: Nose normal.  Mouth/Throat: Oropharynx is clear and moist.  Eyes: Conjunctivae and EOM are normal. Pupils are equal, round, and reactive to light.  Neck: Normal range of motion. Neck supple. No JVD present. No tracheal deviation present or significant neck LA or mass Cardiovascular: Normal rate, regular rhythm, normal heart sounds and intact distal pulses.   Pulmonary/Chest: Effort normal and breath sounds without rales or wheezing  Abdominal: Soft. Bowel sounds are normal. NT. No HSM  Musculoskeletal: Normal range of motion. Exhibits no edema.  Lymphadenopathy:  Has no cervical adenopathy.  Neurological: Pt is alert and oriented to person, place, and time. Pt has normal reflexes. No cranial nerve deficit. Motor grossly intact Skin: Skin is warm and dry. No rash noted.  Psychiatric:  Has normal mood and affect. Behavior is normal.      Assessment & Plan:

## 2015-02-08 NOTE — Patient Instructions (Signed)
Your EKG was [facility] today  Please continue all other medications as before, and refills have been done if requested.  Please have the pharmacy call with any other refills you may need.  Please continue your efforts at being more active, low cholesterol diet, and weight control.  You are otherwise up to date with prevention measures today.  Please keep your appointments with your specialists as you may have planned  Please return in 6 months, or sooner if needed, with Lab testing done 3-5 days before

## 2015-03-01 ENCOUNTER — Other Ambulatory Visit: Payer: Self-pay | Admitting: Internal Medicine

## 2015-04-12 ENCOUNTER — Other Ambulatory Visit: Payer: Self-pay | Admitting: Internal Medicine

## 2015-06-27 ENCOUNTER — Telehealth: Payer: Self-pay

## 2015-06-27 NOTE — Telephone Encounter (Signed)
Patient called to educate on Medicare Wellness apt and no answer

## 2015-07-09 ENCOUNTER — Other Ambulatory Visit: Payer: Self-pay | Admitting: Internal Medicine

## 2015-07-09 NOTE — Telephone Encounter (Signed)
2nd outreach to fup regarding AWV; No answer and work number was busy x 2

## 2015-08-03 ENCOUNTER — Other Ambulatory Visit (INDEPENDENT_AMBULATORY_CARE_PROVIDER_SITE_OTHER): Payer: Medicare Other

## 2015-08-03 DIAGNOSIS — E119 Type 2 diabetes mellitus without complications: Secondary | ICD-10-CM

## 2015-08-03 LAB — LIPID PANEL
CHOLESTEROL: 139 mg/dL (ref 0–200)
HDL: 48.3 mg/dL (ref >39.00)
LDL Cholesterol: 70 mg/dL (ref 0–99)
NonHDL: 90.76
Total CHOL/HDL Ratio: 3
Triglycerides: 104 mg/dL (ref 0.0–149.0)
VLDL: 20.8 mg/dL (ref 0.0–40.0)

## 2015-08-03 LAB — BASIC METABOLIC PANEL
BUN: 21 mg/dL (ref 6–23)
CALCIUM: 9.4 mg/dL (ref 8.4–10.5)
CHLORIDE: 104 meq/L (ref 96–112)
CO2: 29 meq/L (ref 19–32)
Creatinine, Ser: 0.94 mg/dL (ref 0.40–1.50)
GFR: 84.42 mL/min (ref >60.00)
GLUCOSE: 158 mg/dL — AB (ref 70–99)
POTASSIUM: 4.3 meq/L (ref 3.5–5.1)
SODIUM: 140 meq/L (ref 135–145)

## 2015-08-03 LAB — HEPATIC FUNCTION PANEL
ALBUMIN: 4.1 g/dL (ref 3.5–5.2)
ALK PHOS: 71 U/L (ref 39–117)
ALT: 19 U/L (ref 0–53)
AST: 18 U/L (ref 0–37)
Bilirubin, Direct: 0.1 mg/dL (ref 0.0–0.3)
TOTAL PROTEIN: 7.2 g/dL (ref 6.0–8.3)
Total Bilirubin: 0.7 mg/dL (ref 0.2–1.2)

## 2015-08-03 LAB — HEMOGLOBIN A1C: HEMOGLOBIN A1C: 7 % — AB (ref 4.6–6.5)

## 2015-08-10 ENCOUNTER — Ambulatory Visit (INDEPENDENT_AMBULATORY_CARE_PROVIDER_SITE_OTHER): Payer: Medicare Other | Admitting: Internal Medicine

## 2015-08-10 ENCOUNTER — Encounter: Payer: Self-pay | Admitting: Internal Medicine

## 2015-08-10 VITALS — BP 128/76 | HR 52 | Temp 97.6°F | Ht 66.0 in | Wt 187.0 lb

## 2015-08-10 DIAGNOSIS — Z0189 Encounter for other specified special examinations: Secondary | ICD-10-CM

## 2015-08-10 DIAGNOSIS — E119 Type 2 diabetes mellitus without complications: Secondary | ICD-10-CM | POA: Diagnosis not present

## 2015-08-10 DIAGNOSIS — E785 Hyperlipidemia, unspecified: Secondary | ICD-10-CM | POA: Diagnosis not present

## 2015-08-10 DIAGNOSIS — F528 Other sexual dysfunction not due to a substance or known physiological condition: Secondary | ICD-10-CM

## 2015-08-10 DIAGNOSIS — I10 Essential (primary) hypertension: Secondary | ICD-10-CM

## 2015-08-10 DIAGNOSIS — Z0000 Encounter for general adult medical examination without abnormal findings: Secondary | ICD-10-CM

## 2015-08-10 MED ORDER — METFORMIN HCL ER 500 MG PO TB24: ORAL_TABLET | ORAL | Status: DC

## 2015-08-10 MED ORDER — SILDENAFIL CITRATE 20 MG PO TABS: ORAL_TABLET | ORAL | Status: DC

## 2015-08-10 NOTE — Assessment & Plan Note (Signed)
stable overall by history and exam, recent data reviewed with pt, and pt to continue medical treatment as before,  to f/u any worsening symptoms or concerns BP Readings from Last 3 Encounters:  08/10/15 128/76  02/08/15 128/76  07/25/14 140/92

## 2015-08-10 NOTE — Assessment & Plan Note (Signed)
stable overall by history and exam, recent data reviewed with pt, and pt to continue medical treatment as before,  to f/u any worsening symptoms or concerns Lab Results  Component Value Date   LDLCALC 70 08/03/2015

## 2015-08-10 NOTE — Progress Notes (Signed)
Subjective:    Patient ID: Steve Collins, male    DOB: Feb 19, 1946, 69 y.o.   MRN: [id number]  HPI  Here to f/u; overall doing ok,  Pt denies chest pain, increasing sob or doe, wheezing, orthopnea, PND, increased LE swelling, palpitations, dizziness or syncope.  Pt denies new neurological symptoms such as new headache, or facial or extremity weakness or numbness.  Pt denies polydipsia, polyuria, or low sugar episode.   Pt denies new neurological symptoms such as new headache, or facial or extremity weakness or numbness.   Pt states overall good compliance with meds, mostly trying to follow appropriate diet, with wt overall stable,  but little exercise however.  Sees urology x 6 mo., PSA improved aug 2016 to 2.82 from > 7 .  No further c/o's.  Brand name viagra costs too much Past Medical History  Diagnosis Date  . CATARACT NOS 06/08/2007  . COLONIC POLYPS, HX OF 08/17/2008  . DIABETES MELLITUS, TYPE II 10/10/2009  . DIVERTICULOSIS, COLON 08/17/2008  . ERECTILE DYSFUNCTION 06/30/2007  . GERD 06/30/2007  . GOUT 07/09/2010  . HAND PAIN, RIGHT 04/22/2010  . HYPERLIPIDEMIA 06/30/2007  . HYPERTENSION 08/17/2008  . LEUKOCYTOSIS 10/16/2010  . Myalgia 11/12/2010  . OSTEOARTHRITIS, KNEE, LEFT 06/30/2007  . Pain in joint, multiple sites 10/16/2010  . SHOULDER PAIN, BILATERAL 07/09/2010  . WRIST PAIN, RIGHT 07/09/2010  . PMR (polymyalgia rheumatica) ([place]) 05/14/2011  . Coronary artery calcification seen on CAT scan 07/27/2012   Past Surgical History  Procedure Laterality Date  . Cataract extraction    . Inguinal herniorrhapy    . S.p right knee arthroscopic      surgury in his 44's    reports that he has quit smoking. He does not have any smokeless tobacco history on file. He reports that he drinks alcohol. He reports that he does not use illicit drugs. family history includes Dementia in his father and mother; Diabetes in his other; Gout in his brother and maternal grandfather; Parkinsonism in his  other. Allergies  Allergen Reactions  . Hydrocodone Nausea Only   Current Outpatient Prescriptions on File Prior to Visit  Medication Sig Dispense Refill  . aspirin 81 MG tablet Take 81 mg by mouth daily.      Marland Kitchen atorvastatin (LIPITOR) 10 MG tablet TAKE 1 TABLET (10 MG TOTAL) BY MOUTH DAILY AT 6 PM. 90 tablet 1  . losartan (COZAAR) 50 MG tablet TAKE 1 TABLET (50 MG TOTAL) BY MOUTH DAILY. 90 tablet 3  . metoprolol tartrate (LOPRESSOR) 25 MG tablet TAKE 1 TABLET (25 MG TOTAL) BY MOUTH 2 (TWO) TIMES DAILY. 180 tablet 3  . metroNIDAZOLE (METROGEL) 1 % gel Apply topically daily. 45 g 1  . omeprazole (PRILOSEC) 20 MG capsule Take 1 capsule (20 mg total) by mouth daily. 90 capsule 3  . tadalafil (CIALIS) 20 MG tablet Take 1 tablet (20 mg total) by mouth daily as needed for erectile dysfunction. 5 tablet 6  . dorzolamide-timolol (COSOPT) 22.3-6.8 MG/ML ophthalmic solution Place 1 drop into the right eye 2 (two) times daily. Reported on 08/10/2015     No current facility-administered medications on file prior to visit.   Review of Systems  Constitutional: Negative for unusual diaphoresis or night sweats HENT: Negative for ringing in ear or discharge Eyes: Negative for double vision or worsening visual disturbance.  Respiratory: Negative for choking and stridor.   Gastrointestinal: Negative for vomiting or other signifcant bowel change Genitourinary: Negative for hematuria or change in urine volume.  Musculoskeletal: Negative for other MSK pain or swelling Skin: Negative for color change and worsening wound.  Neurological: Negative for tremors and numbness other than noted  Psychiatric/Behavioral: Negative for decreased concentration or agitation other than above       Objective:   Physical Exam BP 128/76 mmHg  Pulse 52  Temp(Src) 97.6 F (36.4 C) (Oral)  Ht 5\' 6"  (1.676 m)  Wt 187 lb (84.823 kg)  BMI 30.20 kg/m2  SpO2 97% BP 128/76 mmHg  Pulse 52  Temp(Src) 97.6 F (36.4 C) (Oral)   Ht 5\' 6"  (1.676 m)  Wt 187 lb (84.823 kg)  BMI 30.20 kg/m2  SpO2 97% VS noted,  Constitutional: Pt appears in no significant distress HENT: Head: NCAT.  Right Ear: External ear normal.  Left Ear: External ear normal.  Eyes: . Pupils are equal, round, and reactive to light. Conjunctivae and EOM are normal Neck: Normal range of motion. Neck supple.  Cardiovascular: Normal rate and regular rhythm.   Pulmonary/Chest: Effort normal and breath sounds without rales or wheezing.  Abd:  Soft, NT, ND, + BS Neurological: Pt is alert. Not confused , motor grossly intact Skin: Skin is warm. No rash, no LE edema Psychiatric: Pt behavior is normal. No agitation.     Assessment & Plan:

## 2015-08-10 NOTE — Patient Instructions (Signed)
Please take all new medication as prescribed - the revatio generic  Please continue all other medications as before, and refills have been done if requested.  Please have the pharmacy call with any other refills you may need.  Please continue your efforts at being more active, low cholesterol diet, and weight control.  Please keep your appointments with your specialists as you may have planned  Please return in 6 months, or sooner if needed, with Lab testing done 3-5 days before

## 2015-08-10 NOTE — Assessment & Plan Note (Signed)
[place] for the revatio prn,  to f/u any worsening symptoms or concerns

## 2015-08-10 NOTE — Progress Notes (Signed)
Pre visit review using our clinic review tool, if applicable. No additional management support is needed unless otherwise documented below in the visit note. 

## 2015-08-10 NOTE — Assessment & Plan Note (Signed)
stable overall by history and exam, recent data reviewed with pt, and pt to continue medical treatment as before,  to f/u any worsening symptoms or concerns  Lab Results  Component Value Date   HGBA1C 7.0* 08/03/2015

## 2015-09-19 DIAGNOSIS — R972 Elevated prostate specific antigen [PSA]: Secondary | ICD-10-CM | POA: Diagnosis not present

## 2015-09-26 DIAGNOSIS — R972 Elevated prostate specific antigen [PSA]: Secondary | ICD-10-CM | POA: Diagnosis not present

## 2015-09-26 DIAGNOSIS — Z0000 Encounter for general adult medical examination without abnormal findings: Secondary | ICD-10-CM | POA: Diagnosis not present

## 2015-09-26 DIAGNOSIS — N400 Enlarged prostate without lower urinary tract symptoms: Secondary | ICD-10-CM | POA: Diagnosis not present

## 2015-10-15 ENCOUNTER — Telehealth: Payer: Self-pay | Admitting: *Deleted

## 2015-10-15 MED ORDER — ATORVASTATIN CALCIUM 10 MG PO TABS: ORAL_TABLET | ORAL | Status: DC

## 2015-10-15 NOTE — Telephone Encounter (Signed)
Receive call pt states since insurance has change need to get medication from [facility]. Requesting rx to be sent on his Astorvastin. Verified pharmacy inform sending electronically...Steve Collins

## 2016-01-17 DIAGNOSIS — I716 Thoracoabdominal aortic aneurysm, without rupture: Secondary | ICD-10-CM | POA: Diagnosis not present

## 2016-01-17 DIAGNOSIS — I719 Aortic aneurysm of unspecified site, without rupture: Secondary | ICD-10-CM | POA: Diagnosis not present

## 2016-01-17 DIAGNOSIS — Z006 Encounter for examination for normal comparison and control in clinical research program: Secondary | ICD-10-CM | POA: Diagnosis not present

## 2016-01-17 DIAGNOSIS — I7779 Dissection of other artery: Secondary | ICD-10-CM | POA: Diagnosis not present

## 2016-01-17 DIAGNOSIS — T82330A Leakage of aortic (bifurcation) graft (replacement), initial encounter: Secondary | ICD-10-CM | POA: Diagnosis not present

## 2016-01-31 ENCOUNTER — Other Ambulatory Visit (INDEPENDENT_AMBULATORY_CARE_PROVIDER_SITE_OTHER): Payer: Medicare Other

## 2016-01-31 DIAGNOSIS — E119 Type 2 diabetes mellitus without complications: Secondary | ICD-10-CM | POA: Diagnosis not present

## 2016-01-31 DIAGNOSIS — Z0189 Encounter for other specified special examinations: Secondary | ICD-10-CM | POA: Diagnosis not present

## 2016-01-31 DIAGNOSIS — Z0000 Encounter for general adult medical examination without abnormal findings: Secondary | ICD-10-CM

## 2016-01-31 LAB — URINALYSIS, ROUTINE W REFLEX MICROSCOPIC
BILIRUBIN URINE: NEGATIVE
HGB URINE DIPSTICK: NEGATIVE
KETONES UR: NEGATIVE
Leukocytes, UA: NEGATIVE
Nitrite: NEGATIVE
SPECIFIC GRAVITY, URINE: 1.025 (ref 1.000–1.030)
Total Protein, Urine: NEGATIVE
UROBILINOGEN UA: 0.2 (ref 0.0–1.0)
Urine Glucose: NEGATIVE
pH: 5.5 (ref 5.0–8.0)

## 2016-01-31 LAB — HEPATIC FUNCTION PANEL
ALK PHOS: 78 U/L (ref 39–117)
ALT: 17 U/L (ref 0–53)
AST: 17 U/L (ref 0–37)
Albumin: 4.3 g/dL (ref 3.5–5.2)
BILIRUBIN DIRECT: 0.1 mg/dL (ref 0.0–0.3)
TOTAL PROTEIN: 6.8 g/dL (ref 6.0–8.3)
Total Bilirubin: 0.7 mg/dL (ref 0.2–1.2)

## 2016-01-31 LAB — CBC WITH DIFFERENTIAL/PLATELET
BASOS ABS: 0 10*3/uL (ref 0.0–0.1)
Basophils Relative: 0.3 % (ref 0.0–3.0)
EOS ABS: 0.1 10*3/uL (ref 0.0–0.7)
Eosinophils Relative: 1.5 % (ref 0.0–5.0)
HCT: 37.9 % — ABNORMAL LOW (ref 39.0–52.0)
Hemoglobin: 13 g/dL (ref 13.0–17.0)
LYMPHS ABS: 2.5 10*3/uL (ref 0.7–4.0)
Lymphocytes Relative: 28 % (ref 12.0–46.0)
MCHC: 34.2 g/dL (ref 30.0–36.0)
MCV: 91.4 fl (ref 78.0–100.0)
MONO ABS: 0.5 10*3/uL (ref 0.1–1.0)
Monocytes Relative: 6.1 % (ref 3.0–12.0)
NEUTROS ABS: 5.7 10*3/uL (ref 1.4–7.7)
NEUTROS PCT: 64.1 % (ref 43.0–77.0)
PLATELETS: 194 10*3/uL (ref 150.0–400.0)
RBC: 4.15 Mil/uL — ABNORMAL LOW (ref 4.22–5.81)
RDW: 15.2 % (ref 11.5–15.5)
WBC: 8.9 10*3/uL (ref 4.0–10.5)

## 2016-01-31 LAB — MICROALBUMIN / CREATININE URINE RATIO
Creatinine,U: 149.1 mg/dL
MICROALB UR: 0.7 mg/dL (ref 0.0–1.9)
MICROALB/CREAT RATIO: 0.5 mg/g (ref 0.0–30.0)

## 2016-01-31 LAB — BASIC METABOLIC PANEL
BUN: 23 mg/dL (ref 6–23)
CHLORIDE: 103 meq/L (ref 96–112)
CO2: 29 meq/L (ref 19–32)
Calcium: 9.4 mg/dL (ref 8.4–10.5)
Creatinine, Ser: 0.94 mg/dL (ref 0.40–1.50)
GFR: 84.3 mL/min (ref >60.00)
Glucose, Bld: 156 mg/dL — ABNORMAL HIGH (ref 70–99)
Potassium: 4.6 mEq/L (ref 3.5–5.1)
SODIUM: 137 meq/L (ref 135–145)

## 2016-01-31 LAB — HEMOGLOBIN A1C: HEMOGLOBIN A1C: 7 % — AB (ref 4.6–6.5)

## 2016-01-31 LAB — LIPID PANEL
CHOLESTEROL: 136 mg/dL (ref 0–200)
HDL: 44.7 mg/dL (ref >39.00)
LDL Cholesterol: 67 mg/dL (ref 0–99)
NONHDL: 90.98
Total CHOL/HDL Ratio: 3
Triglycerides: 121 mg/dL (ref 0.0–149.0)
VLDL: 24.2 mg/dL (ref 0.0–40.0)

## 2016-01-31 LAB — TSH: TSH: 1.25 u[IU]/mL (ref 0.35–4.50)

## 2016-01-31 LAB — PSA: PSA: 10.89 ng/mL — AB (ref 0.10–4.00)

## 2016-02-06 ENCOUNTER — Encounter: Payer: Self-pay | Admitting: Internal Medicine

## 2016-02-06 ENCOUNTER — Ambulatory Visit (INDEPENDENT_AMBULATORY_CARE_PROVIDER_SITE_OTHER): Payer: Medicare Other | Admitting: Internal Medicine

## 2016-02-06 VITALS — BP 136/82 | HR 66 | Temp 98.3°F | Resp 20 | Wt 184.0 lb

## 2016-02-06 DIAGNOSIS — Z0001 Encounter for general adult medical examination with abnormal findings: Secondary | ICD-10-CM | POA: Diagnosis not present

## 2016-02-06 DIAGNOSIS — R6889 Other general symptoms and signs: Secondary | ICD-10-CM | POA: Diagnosis not present

## 2016-02-06 DIAGNOSIS — I10 Essential (primary) hypertension: Secondary | ICD-10-CM | POA: Diagnosis not present

## 2016-02-06 DIAGNOSIS — E785 Hyperlipidemia, unspecified: Secondary | ICD-10-CM

## 2016-02-06 DIAGNOSIS — K219 Gastro-esophageal reflux disease without esophagitis: Secondary | ICD-10-CM | POA: Diagnosis not present

## 2016-02-06 DIAGNOSIS — E119 Type 2 diabetes mellitus without complications: Secondary | ICD-10-CM | POA: Diagnosis not present

## 2016-02-06 NOTE — Patient Instructions (Signed)
Please continue all other medications as before, and refills have been done if requested.  Please have the pharmacy call with any other refills you may need.  Please continue your efforts at being more active, low cholesterol diet, and weight control.  You are otherwise up to date with prevention measures today.  Please keep your appointments with your specialists as you may have planned  Please return in 6 months, or sooner if needed, with Lab testing done 3-5 days before  

## 2016-02-06 NOTE — Progress Notes (Signed)
Subjective:    Patient ID: Steve Collins, male    DOB: [birth year], 70 y.o.   MRN: [id number]  HPI  Here for wellness and f/u;  Overall doing ok;  Pt denies Chest pain, worsening SOB, DOE, wheezing, orthopnea, PND, worsening LE edema, palpitations, dizziness or syncope.  Pt denies neurological change such as new headache, facial or extremity weakness. Pt states overall good compliance with treatment and medications, good tolerability, and has been trying to follow appropriate diet.  Pt denies worsening depressive symptoms, suicidal ideation or panic. No fever, night sweats, wt loss, loss of appetite, or other constitutional symptoms.  Pt states good ability with ADL's, has low fall risk, home safety reviewed and adequate, no other significant changes in hearing or vision.  Tolerating statin well, trying to follow lower chol diet.  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     Pt denies polydipsia, polyuria, or low sugar symptoms such as weakness or confusion improved with po intake.  Pt states overall good compliance with meds, trying to follow lower cholesterol, diabetic diet, wt overall stable but little exercise however.       Denies urinary symptoms such as dysuria, frequency, urgency, flank pain, hematuria or n/v, fever, chills.  Sees urology every 6 mo with PSA, s/p prostate biopsy neg x 1. Denies worsening reflux, abd pain, dysphagia, n/v, bowel change or blood.  Has several good BP recently on the lower dose losartan.  Overall good compliance with treatment, and good medicine tolerability. Past Medical History  Diagnosis Date  . CATARACT NOS 06/08/2007  . COLONIC POLYPS, HX OF 08/17/2008  . DIABETES MELLITUS, TYPE II 10/10/2009  . DIVERTICULOSIS, COLON 08/17/2008  . ERECTILE DYSFUNCTION 06/30/2007  . GERD 06/30/2007  . GOUT 07/09/2010  . HAND PAIN, RIGHT 04/22/2010  . HYPERLIPIDEMIA 06/30/2007  . HYPERTENSION 08/17/2008  . LEUKOCYTOSIS 10/16/2010  . Myalgia 11/12/2010    . OSTEOARTHRITIS, KNEE, LEFT 06/30/2007  . Pain in joint, multiple sites 10/16/2010  . SHOULDER PAIN, BILATERAL 07/09/2010  . WRIST PAIN, RIGHT 07/09/2010  . PMR (polymyalgia rheumatica) ([place]) 05/14/2011  . Coronary artery calcification seen on CAT scan 07/27/2012   Past Surgical History  Procedure Laterality Date  . Cataract extraction    . Inguinal herniorrhapy    . S.p right knee arthroscopic      surgury in his 23's    reports that he has quit smoking. He does not have any smokeless tobacco history on file. He reports that he drinks alcohol. He reports that he does not use illicit drugs. family history includes Dementia in his father and mother; Diabetes in his other; Gout in his brother and maternal grandfather; Parkinsonism in his other. Allergies  Allergen Reactions  . Hydrocodone Nausea Only   Current Outpatient Prescriptions on File Prior to Visit  Medication Sig Dispense Refill  . aspirin 81 MG tablet Take 81 mg by mouth daily.      Marland Kitchen atorvastatin (LIPITOR) 10 MG tablet TAKE 1 TABLET (10 MG TOTAL) BY MOUTH DAILY AT 6 PM. Yearly physical w/labs due in July 90 tablet 1  . losartan (COZAAR) 50 MG tablet TAKE 1 TABLET (50 MG TOTAL) BY MOUTH DAILY. 90 tablet 3  . metFORMIN (GLUCOPHAGE-XR) 500 MG 24 hr tablet 2 tabs by mouth per day 180 tablet 3  . metroNIDAZOLE (METROGEL) 1 % gel Apply topically daily. 45 g 1  . omeprazole (PRILOSEC) 20 MG capsule Take 1 capsule (20 mg total)  by mouth daily. 90 capsule 3  . sildenafil (REVATIO) 20 MG tablet 3-5 tabs by mouth daily as needed 60 tablet 5   No current facility-administered medications on file prior to visit.   Review of Systems Constitutional: Negative for increased diaphoresis, or other activity, appetite or siginficant weight change other than noted HENT: Negative for worsening hearing loss, ear pain, facial swelling, mouth sores and neck stiffness.   Eyes: Negative for other worsening pain, redness or visual disturbance.   Respiratory: Negative for choking or stridor Cardiovascular: Negative for other chest pain and palpitations.  Gastrointestinal: Negative for worsening diarrhea, blood in stool, or abdominal distention Genitourinary: Negative for hematuria, flank pain or change in urine volume.  Musculoskeletal: Negative for myalgias or other joint complaints.  Skin: Negative for other color change and wound or drainage.  Neurological: Negative for syncope and numbness. other than noted Hematological: Negative for adenopathy. or other swelling Psychiatric/Behavioral: Negative for hallucinations, SI, self-injury, decreased concentration or other worsening agitation.      Objective:   Physical Exam BP 136/82 mmHg  Pulse 66  Temp(Src) 98.3 F (36.8 C) (Oral)  Resp 20  Wt 184 lb (83.462 kg)  SpO2 96% VS noted,  Constitutional: Pt is oriented to person, place, and time. Appears well-developed and well-nourished, in no significant distress Head: Normocephalic and atraumatic  Eyes: Conjunctivae and EOM are normal. Pupils are equal, round, and reactive to light Right Ear: External ear normal.  Left Ear: External ear normal Nose: Nose normal.  Mouth/Throat: Oropharynx is clear and moist  Neck: Normal range of motion. Neck supple. No JVD present. No tracheal deviation present or significant neck LA or mass Cardiovascular: Normal rate, regular rhythm, normal heart sounds and intact distal pulses.   Pulmonary/Chest: Effort normal and breath sounds without rales or wheezing  Abdominal: Soft. Bowel sounds are normal. NT. No HSM  Musculoskeletal: Normal range of motion. Exhibits no edema Lymphadenopathy: Has no cervical adenopathy.  Neurological: Pt is alert and oriented to person, place, and time. Pt has normal reflexes. No cranial nerve deficit. Motor grossly intact Skin: Skin is warm and dry. No rash noted or new ulcers Psychiatric:  Has normal mood and affect. Behavior is normal.     Assessment & Plan:

## 2016-02-06 NOTE — Progress Notes (Signed)
Pre visit review using our clinic review tool, if applicable. No additional management support is needed unless otherwise documented below in the visit note. 

## 2016-02-09 ENCOUNTER — Other Ambulatory Visit: Payer: Self-pay | Admitting: Internal Medicine

## 2016-02-11 NOTE — Assessment & Plan Note (Addendum)
stable overall by history and exam, recent data reviewed with pt, and pt to continue medical treatment as before,  to f/u any worsening symptoms or concerns Lab Results  Component Value Date   HGBA1C 7.0* 01/31/2016   In addition to the time spent performing CPE, I spent an additional 15 minutes face to face,in which greater than 50% of this time was spent in counseling and coordination of care for patient's illness as documented.

## 2016-02-11 NOTE — Assessment & Plan Note (Signed)
stable overall by history and exam, recent data reviewed with pt, and pt to continue medical treatment as before,  to f/u any worsening symptoms or concerns BP Readings from Last 3 Encounters:  02/06/16 136/82  08/10/15 128/76  02/08/15 128/76

## 2016-02-11 NOTE — Assessment & Plan Note (Signed)

## 2016-02-11 NOTE — Assessment & Plan Note (Signed)
stable overall by history and exam, recent data reviewed with pt, and pt to continue medical treatment as before,  to f/u any worsening symptoms or concerns Lab Results  Component Value Date   LDLCALC 67 01/31/2016

## 2016-02-11 NOTE — Assessment & Plan Note (Signed)
stable overall by history and exam, and pt to continue medical treatment as before,  to f/u any worsening symptoms or concerns 

## 2016-02-14 ENCOUNTER — Other Ambulatory Visit: Payer: Self-pay | Admitting: Internal Medicine

## 2016-03-19 DIAGNOSIS — H17813 Minor opacity of cornea, bilateral: Secondary | ICD-10-CM | POA: Diagnosis not present

## 2016-03-19 DIAGNOSIS — E119 Type 2 diabetes mellitus without complications: Secondary | ICD-10-CM | POA: Diagnosis not present

## 2016-03-19 DIAGNOSIS — Z9841 Cataract extraction status, right eye: Secondary | ICD-10-CM | POA: Diagnosis not present

## 2016-03-19 DIAGNOSIS — Z87891 Personal history of nicotine dependence: Secondary | ICD-10-CM | POA: Diagnosis not present

## 2016-03-19 DIAGNOSIS — Z9842 Cataract extraction status, left eye: Secondary | ICD-10-CM | POA: Diagnosis not present

## 2016-03-19 DIAGNOSIS — H3322 Serous retinal detachment, left eye: Secondary | ICD-10-CM | POA: Diagnosis not present

## 2016-03-19 DIAGNOSIS — Z947 Corneal transplant status: Secondary | ICD-10-CM | POA: Diagnosis not present

## 2016-03-19 DIAGNOSIS — H35342 Macular cyst, hole, or pseudohole, left eye: Secondary | ICD-10-CM | POA: Diagnosis not present

## 2016-03-19 DIAGNOSIS — Z7984 Long term (current) use of oral hypoglycemic drugs: Secondary | ICD-10-CM | POA: Diagnosis not present

## 2016-03-19 DIAGNOSIS — Z961 Presence of intraocular lens: Secondary | ICD-10-CM | POA: Diagnosis not present

## 2016-03-26 DIAGNOSIS — R972 Elevated prostate specific antigen [PSA]: Secondary | ICD-10-CM | POA: Diagnosis not present

## 2016-03-26 DIAGNOSIS — R351 Nocturia: Secondary | ICD-10-CM | POA: Diagnosis not present

## 2016-04-04 ENCOUNTER — Other Ambulatory Visit: Payer: Self-pay | Admitting: Internal Medicine

## 2016-04-24 DIAGNOSIS — L814 Other melanin hyperpigmentation: Secondary | ICD-10-CM | POA: Diagnosis not present

## 2016-04-24 DIAGNOSIS — L718 Other rosacea: Secondary | ICD-10-CM | POA: Diagnosis not present

## 2016-04-24 DIAGNOSIS — D225 Melanocytic nevi of trunk: Secondary | ICD-10-CM | POA: Diagnosis not present

## 2016-04-24 DIAGNOSIS — L821 Other seborrheic keratosis: Secondary | ICD-10-CM | POA: Diagnosis not present

## 2016-05-10 ENCOUNTER — Other Ambulatory Visit: Payer: Self-pay | Admitting: Internal Medicine

## 2016-06-30 ENCOUNTER — Other Ambulatory Visit: Payer: Self-pay | Admitting: Internal Medicine

## 2016-07-01 ENCOUNTER — Other Ambulatory Visit: Payer: Self-pay | Admitting: Internal Medicine

## 2016-07-14 DIAGNOSIS — Z23 Encounter for immunization: Secondary | ICD-10-CM | POA: Diagnosis not present

## 2016-08-01 ENCOUNTER — Other Ambulatory Visit (INDEPENDENT_AMBULATORY_CARE_PROVIDER_SITE_OTHER): Payer: Medicare Other

## 2016-08-01 DIAGNOSIS — E119 Type 2 diabetes mellitus without complications: Secondary | ICD-10-CM

## 2016-08-01 LAB — BASIC METABOLIC PANEL
BUN: 18 mg/dL (ref 6–23)
CHLORIDE: 104 meq/L (ref 96–112)
CO2: 30 meq/L (ref 19–32)
Calcium: 8.7 mg/dL (ref 8.4–10.5)
Creatinine, Ser: 0.88 mg/dL (ref 0.40–1.50)
GFR: 90.84 mL/min (ref >60.00)
GLUCOSE: 142 mg/dL — AB (ref 70–99)
POTASSIUM: 4.4 meq/L (ref 3.5–5.1)
SODIUM: 140 meq/L (ref 135–145)

## 2016-08-01 LAB — LIPID PANEL
CHOLESTEROL: 119 mg/dL (ref 0–200)
HDL: 50.1 mg/dL (ref >39.00)
LDL Cholesterol: 53 mg/dL (ref 0–99)
NonHDL: 68.86
TRIGLYCERIDES: 77 mg/dL (ref 0.0–149.0)
Total CHOL/HDL Ratio: 2
VLDL: 15.4 mg/dL (ref 0.0–40.0)

## 2016-08-01 LAB — HEPATIC FUNCTION PANEL
ALK PHOS: 69 U/L (ref 39–117)
ALT: 20 U/L (ref 0–53)
AST: 19 U/L (ref 0–37)
Albumin: 4.1 g/dL (ref 3.5–5.2)
Bilirubin, Direct: 0.1 mg/dL (ref 0.0–0.3)
TOTAL PROTEIN: 6.8 g/dL (ref 6.0–8.3)
Total Bilirubin: 0.6 mg/dL (ref 0.2–1.2)

## 2016-08-01 LAB — HEMOGLOBIN A1C: HEMOGLOBIN A1C: 6.4 % (ref 4.6–6.5)

## 2016-08-05 ENCOUNTER — Ambulatory Visit (INDEPENDENT_AMBULATORY_CARE_PROVIDER_SITE_OTHER): Payer: Medicare Other | Admitting: Internal Medicine

## 2016-08-05 ENCOUNTER — Encounter: Payer: Self-pay | Admitting: Internal Medicine

## 2016-08-05 VITALS — BP 136/82 | HR 57 | Temp 98.2°F | Resp 20 | Wt 186.0 lb

## 2016-08-05 DIAGNOSIS — Z0001 Encounter for general adult medical examination with abnormal findings: Secondary | ICD-10-CM | POA: Diagnosis not present

## 2016-08-05 DIAGNOSIS — I10 Essential (primary) hypertension: Secondary | ICD-10-CM | POA: Diagnosis not present

## 2016-08-05 DIAGNOSIS — E785 Hyperlipidemia, unspecified: Secondary | ICD-10-CM

## 2016-08-05 DIAGNOSIS — E119 Type 2 diabetes mellitus without complications: Secondary | ICD-10-CM | POA: Diagnosis not present

## 2016-08-05 NOTE — Assessment & Plan Note (Signed)
stable overall by history and exam, recent data reviewed with pt, and pt to continue medical treatment as before,  to f/u any worsening symptoms or concerns BP Readings from Last 3 Encounters:  08/05/16 136/82  02/06/16 136/82  08/10/15 128/76

## 2016-08-05 NOTE — Progress Notes (Signed)
Pre visit review using our clinic review tool, if applicable. No additional management support is needed unless otherwise documented below in the visit note. 

## 2016-08-05 NOTE — Progress Notes (Signed)
Subjective:    Patient ID: Steve Collins, male    DOB: 06-21-1946, 70 y.o.   MRN: [id number]  HPI  Here to f/u; overall doing ok,  Pt denies chest pain, increasing sob or doe, wheezing, orthopnea, PND, increased LE swelling, palpitations, dizziness or syncope.  Pt denies new neurological symptoms such as new headache, or facial or extremity weakness or numbness.  Pt denies polydipsia, polyuria, or low sugar episode.   Pt denies new neurological symptoms such as new headache, or facial or extremity weakness or numbness.   Pt states overall good compliance with meds, mostly trying to follow appropriate diet, with wt overall stable, trying to remain active at the gym several times per wk.   Wt Readings from Last 3 Encounters:  08/05/16 186 lb (84.4 kg)  02/06/16 184 lb (83.5 kg)  08/10/15 187 lb (84.8 kg)  Does mention has persitent but non worsening vision blurriness left > right that makes driving and distance/sign reading difficult due to retinal dz, and has f/u appt soon  No other new history Past Medical History:  Diagnosis Date  . CATARACT NOS 06/08/2007  . COLONIC POLYPS, HX OF 08/17/2008  . Coronary artery calcification seen on CAT scan 07/27/2012  . DIABETES MELLITUS, TYPE II 10/10/2009  . DIVERTICULOSIS, COLON 08/17/2008  . ERECTILE DYSFUNCTION 06/30/2007  . GERD 06/30/2007  . GOUT 07/09/2010  . HAND PAIN, RIGHT 04/22/2010  . HYPERLIPIDEMIA 06/30/2007  . HYPERTENSION 08/17/2008  . LEUKOCYTOSIS 10/16/2010  . Myalgia 11/12/2010  . OSTEOARTHRITIS, KNEE, LEFT 06/30/2007  . Pain in joint, multiple sites 10/16/2010  . PMR (polymyalgia rheumatica) ([place]) 05/14/2011  . SHOULDER PAIN, BILATERAL 07/09/2010  . WRIST PAIN, RIGHT 07/09/2010   Past Surgical History:  Procedure Laterality Date  . CATARACT EXTRACTION    . inguinal herniorrhapy    . s.p right knee arthroscopic     surgury in his 33's    reports that he has quit smoking. He does not have any smokeless tobacco history on file. He reports  that he drinks alcohol. He reports that he does not use drugs. family history includes Dementia in his father and mother; Diabetes in his other; Gout in his brother and maternal grandfather; Parkinsonism in his other. Allergies  Allergen Reactions  . Hydrocodone Nausea Only   Current Outpatient Prescriptions on File Prior to Visit  Medication Sig Dispense Refill  . aspirin 81 MG tablet Take 81 mg by mouth daily.      Marland Kitchen atorvastatin (LIPITOR) 10 MG tablet TAKE ONE TABLET BY MOUTH ONCE DAILY AT 6PM 90 tablet 0  . losartan (COZAAR) 50 MG tablet TAKE 1 TABLET BY MOUTH DAILY 90 tablet 1  . metFORMIN (GLUCOPHAGE-XR) 500 MG 24 hr tablet 2 tabs by mouth per day 180 tablet 3  . metoprolol tartrate (LOPRESSOR) 25 MG tablet TAKE 1 TABLET BY MOUTH 2 (TWO) TIMES DAILY 180 tablet 2  . metroNIDAZOLE (METROGEL) 1 % gel Apply topically daily. 45 g 1  . METRONIDAZOLE, TOPICAL, 0.75 % LOTN APPLY TWICE DAILY TO FACE 60 mL 1  . omeprazole (PRILOSEC) 20 MG capsule Take 1 capsule (20 mg total) by mouth daily. 90 capsule 3  . sildenafil (REVATIO) 20 MG tablet 3-5 tabs by mouth daily as needed 60 tablet 5   No current facility-administered medications on file prior to visit.    Review of Systems  Constitutional: Negative for unusual diaphoresis or night sweats HENT: Negative for ear swelling or discharge Eyes: Negative for worsening visual haziness  Respiratory: Negative for choking and stridor.   Gastrointestinal: Negative for distension or worsening eructation Genitourinary: Negative for retention or change in urine volume.  Musculoskeletal: Negative for other MSK pain or swelling Skin: Negative for color change and worsening wound Neurological: Negative for tremors and numbness other than noted  Psychiatric/Behavioral: Negative for decreased concentration or agitation other than above   All other system neg per pt    Objective:   Physical Exam BP 136/82   Pulse (!) 57   Temp 98.2 F (36.8 C) (Oral)    Resp 20   Wt 186 lb (84.4 kg)   SpO2 93%   BMI 30.02 kg/m  VS noted,  Constitutional: Pt appears in no apparent distress HENT: Head: NCAT.  Right Ear: External ear normal.  Left Ear: External ear normal.  Eyes: . Pupils are equal, round, and reactive to light. Conjunctivae and EOM are normal Neck: Normal range of motion. Neck supple.  Cardiovascular: Normal rate and regular rhythm.   Pulmonary/Chest: Effort normal and breath sounds without rales or wheezing.  Neurological: Pt is alert. Not confused , motor grossly intact Skin: Skin is warm. No rash, no LE edema Psychiatric: Pt behavior is normal. No agitation.  No other new exam findings    Assessment & Plan:

## 2016-08-05 NOTE — Patient Instructions (Signed)
Please continue all other medications as before, and refills have been done if requested.  Please have the pharmacy call with any other refills you may need.  Please continue your efforts at being more active, low cholesterol diet, and weight control.  You are otherwise up to date with prevention measures today.  Please keep your appointments with your specialists as you may have planned - opthalmology  Please return in 6 months, or sooner if needed, with Lab testing done 3-5 days before

## 2016-08-05 NOTE — Assessment & Plan Note (Signed)
stable overall by history and exam, recent data reviewed with pt, and pt to continue medical treatment as before,  to f/u any worsening symptoms or concerns Lab Results  Component Value Date   LDLCALC 53 112/29/202017

## 2016-08-05 NOTE — Assessment & Plan Note (Signed)
/  stable overall by history and exam, recent data reviewed with pt, and pt to continue medical treatment as before,  to f/u any worsening symptoms or concerns Lab Results  Component Value Date   HGBA1C 6.4 12020-09-415

## 2016-08-29 ENCOUNTER — Other Ambulatory Visit: Payer: Self-pay | Admitting: Internal Medicine

## 2016-09-19 DIAGNOSIS — Z9889 Other specified postprocedural states: Secondary | ICD-10-CM | POA: Diagnosis not present

## 2016-09-19 DIAGNOSIS — H3322 Serous retinal detachment, left eye: Secondary | ICD-10-CM | POA: Diagnosis not present

## 2016-09-19 DIAGNOSIS — Z961 Presence of intraocular lens: Secondary | ICD-10-CM | POA: Diagnosis not present

## 2016-09-19 DIAGNOSIS — Z9842 Cataract extraction status, left eye: Secondary | ICD-10-CM | POA: Diagnosis not present

## 2016-09-19 DIAGNOSIS — H35342 Macular cyst, hole, or pseudohole, left eye: Secondary | ICD-10-CM | POA: Diagnosis not present

## 2016-09-19 DIAGNOSIS — Z947 Corneal transplant status: Secondary | ICD-10-CM | POA: Diagnosis not present

## 2016-09-20 ENCOUNTER — Other Ambulatory Visit: Payer: Self-pay | Admitting: Internal Medicine

## 2016-09-23 DIAGNOSIS — R972 Elevated prostate specific antigen [PSA]: Secondary | ICD-10-CM | POA: Diagnosis not present

## 2016-09-25 DIAGNOSIS — R351 Nocturia: Secondary | ICD-10-CM | POA: Diagnosis not present

## 2016-09-25 DIAGNOSIS — R972 Elevated prostate specific antigen [PSA]: Secondary | ICD-10-CM | POA: Diagnosis not present

## 2016-09-25 DIAGNOSIS — N401 Enlarged prostate with lower urinary tract symptoms: Secondary | ICD-10-CM | POA: Diagnosis not present

## 2016-09-26 ENCOUNTER — Other Ambulatory Visit: Payer: Self-pay | Admitting: Internal Medicine

## 2016-12-09 ENCOUNTER — Other Ambulatory Visit: Payer: Self-pay | Admitting: Internal Medicine

## 2016-12-11 ENCOUNTER — Other Ambulatory Visit: Payer: Self-pay | Admitting: Internal Medicine

## 2016-12-29 ENCOUNTER — Other Ambulatory Visit: Payer: Self-pay | Admitting: Internal Medicine

## 2017-01-29 ENCOUNTER — Other Ambulatory Visit (INDEPENDENT_AMBULATORY_CARE_PROVIDER_SITE_OTHER): Payer: Medicare Other

## 2017-01-29 DIAGNOSIS — Z0001 Encounter for general adult medical examination with abnormal findings: Secondary | ICD-10-CM

## 2017-01-29 DIAGNOSIS — E119 Type 2 diabetes mellitus without complications: Secondary | ICD-10-CM

## 2017-01-29 LAB — URINALYSIS, ROUTINE W REFLEX MICROSCOPIC
Bilirubin Urine: NEGATIVE
HGB URINE DIPSTICK: NEGATIVE
Ketones, ur: NEGATIVE
Leukocytes, UA: NEGATIVE
NITRITE: NEGATIVE
RBC / HPF: NONE SEEN (ref 0–?)
SPECIFIC GRAVITY, URINE: 1.025 (ref 1.000–1.030)
TOTAL PROTEIN, URINE-UPE24: NEGATIVE
Urine Glucose: NEGATIVE
Urobilinogen, UA: 0.2 (ref 0.0–1.0)
WBC, UA: NONE SEEN (ref 0–?)
pH: 5.5 (ref 5.0–8.0)

## 2017-01-29 LAB — CBC WITH DIFFERENTIAL/PLATELET
BASOS PCT: 0.5 % (ref 0.0–3.0)
Basophils Absolute: 0 10*3/uL (ref 0.0–0.1)
EOS ABS: 0.2 10*3/uL (ref 0.0–0.7)
EOS PCT: 1.7 % (ref 0.0–5.0)
HCT: 38.8 % — ABNORMAL LOW (ref 39.0–52.0)
HEMOGLOBIN: 13.2 g/dL (ref 13.0–17.0)
LYMPHS ABS: 2.9 10*3/uL (ref 0.7–4.0)
Lymphocytes Relative: 30.1 % (ref 12.0–46.0)
MCHC: 34.1 g/dL (ref 30.0–36.0)
MCV: 92.3 fl (ref 78.0–100.0)
MONO ABS: 0.5 10*3/uL (ref 0.1–1.0)
Monocytes Relative: 4.9 % (ref 3.0–12.0)
NEUTROS ABS: 6.1 10*3/uL (ref 1.4–7.7)
NEUTROS PCT: 62.8 % (ref 43.0–77.0)
PLATELETS: 196 10*3/uL (ref 150.0–400.0)
RBC: 4.2 Mil/uL — ABNORMAL LOW (ref 4.22–5.81)
RDW: 14.8 % (ref 11.5–15.5)
WBC: 9.8 10*3/uL (ref 4.0–10.5)

## 2017-01-29 LAB — MICROALBUMIN / CREATININE URINE RATIO
Creatinine,U: 155.7 mg/dL
Microalb Creat Ratio: 0.5 mg/g (ref 0.0–30.0)
Microalb, Ur: 0.8 mg/dL (ref 0.0–1.9)

## 2017-01-29 LAB — HEPATIC FUNCTION PANEL
ALK PHOS: 83 U/L (ref 39–117)
ALT: 17 U/L (ref 0–53)
AST: 16 U/L (ref 0–37)
Albumin: 4.3 g/dL (ref 3.5–5.2)
BILIRUBIN DIRECT: 0.1 mg/dL (ref 0.0–0.3)
BILIRUBIN TOTAL: 0.5 mg/dL (ref 0.2–1.2)
Total Protein: 7 g/dL (ref 6.0–8.3)

## 2017-01-29 LAB — BASIC METABOLIC PANEL
BUN: 23 mg/dL (ref 6–23)
CO2: 27 mEq/L (ref 19–32)
Calcium: 9.4 mg/dL (ref 8.4–10.5)
Chloride: 104 mEq/L (ref 96–112)
Creatinine, Ser: 0.89 mg/dL (ref 0.40–1.50)
GFR: 89.53 mL/min (ref >60.00)
GLUCOSE: 175 mg/dL — AB (ref 70–99)
POTASSIUM: 4.3 meq/L (ref 3.5–5.1)
Sodium: 139 mEq/L (ref 135–145)

## 2017-01-29 LAB — LIPID PANEL
CHOLESTEROL: 131 mg/dL (ref 0–200)
HDL: 44.2 mg/dL (ref >39.00)
LDL CALC: 60 mg/dL (ref 0–99)
NonHDL: 86.65
TRIGLYCERIDES: 133 mg/dL (ref 0.0–149.0)
Total CHOL/HDL Ratio: 3
VLDL: 26.6 mg/dL (ref 0.0–40.0)

## 2017-01-29 LAB — HEMOGLOBIN A1C: Hgb A1c MFr Bld: 6.9 % — ABNORMAL HIGH (ref 4.6–6.5)

## 2017-01-29 LAB — TSH: TSH: 1.3 u[IU]/mL (ref 0.35–4.50)

## 2017-02-03 ENCOUNTER — Encounter: Payer: Self-pay | Admitting: Internal Medicine

## 2017-02-03 ENCOUNTER — Ambulatory Visit (INDEPENDENT_AMBULATORY_CARE_PROVIDER_SITE_OTHER): Payer: Medicare Other | Admitting: Internal Medicine

## 2017-02-03 VITALS — BP 150/88 | HR 86 | Ht 66.0 in | Wt 183.0 lb

## 2017-02-03 DIAGNOSIS — Z1159 Encounter for screening for other viral diseases: Secondary | ICD-10-CM | POA: Diagnosis not present

## 2017-02-03 DIAGNOSIS — E119 Type 2 diabetes mellitus without complications: Secondary | ICD-10-CM

## 2017-02-03 DIAGNOSIS — R351 Nocturia: Secondary | ICD-10-CM

## 2017-02-03 DIAGNOSIS — N401 Enlarged prostate with lower urinary tract symptoms: Secondary | ICD-10-CM | POA: Insufficient documentation

## 2017-02-03 DIAGNOSIS — Z0000 Encounter for general adult medical examination without abnormal findings: Secondary | ICD-10-CM | POA: Diagnosis not present

## 2017-02-03 MED ORDER — METFORMIN HCL ER 500 MG PO TB24: 500.0000 mg | ORAL_TABLET | Freq: Every day | ORAL | 3 refills | Status: DC

## 2017-02-03 MED ORDER — TAMSULOSIN HCL 0.4 MG PO CAPS: 0.4000 mg | ORAL_CAPSULE | Freq: Every day | ORAL | 11 refills | Status: AC

## 2017-02-03 NOTE — Patient Instructions (Signed)
Please continue all other medications as before, including the metformin at one per day  Please have the pharmacy call with any other refills you may need.  Please continue your efforts at being more active, low cholesterol diet, and weight control.  You are otherwise up to date with prevention measures today.  Please keep your appointments with your specialists as you may have planned  Please return in 6 months, or sooner if needed, with Lab testing done 3-5 days before

## 2017-02-03 NOTE — Progress Notes (Signed)
Subjective:    Patient ID: Steve Collins, male    DOB: 1946-04-01, 71 y.o.   MRN: [id number]  HPI  Here for wellness and f/u;  Overall doing ok;  Pt denies Chest pain, worsening SOB, DOE, wheezing, orthopnea, PND, worsening LE edema, palpitations, dizziness or syncope.  Pt denies neurological change such as new headache, facial or extremity weakness.  Pt denies polydipsia, polyuria, or low sugar symptoms. Pt states overall good compliance with treatment and medications, good tolerability, and has been trying to follow appropriate diet.  Pt denies worsening depressive symptoms, suicidal ideation or panic. No fever, night sweats, wt loss, loss of appetite, or other constitutional symptoms.  Pt states good ability with ADL's, has low fall risk, home safety reviewed and adequate, no other significant changes in hearing or vision, and 5 days per wk at gym -  occasionally active with exercise. BP has been ok at home, < 140/90, not sure why today is mild high.  ? Confusion about metformin, only taking one per day.  Has frequent eye exams s/p retinal detachment tx at [facility], last exam feb 2018. 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  Due for colonscopy but has not had since 2004 since he understood the cost would too high at the recommended 5 yr f/u.   Has PSA every 6 mo with Dr Karsten Ro for BPH Wt Readings from Last 3 Encounters:  02/03/17 183 lb (83 kg)  08/05/16 186 lb (84.4 kg)  02/06/16 184 lb (83.5 kg)   BP Readings from Last 3 Encounters:  02/03/17 (!) 150/88  08/05/16 136/82  02/06/16 136/82   Past Medical History:  Diagnosis Date  . CATARACT NOS 06/08/2007  . COLONIC POLYPS, HX OF 08/17/2008  . Coronary artery calcification seen on CAT scan 07/27/2012  . DIABETES MELLITUS, TYPE II 10/10/2009  . DIVERTICULOSIS, COLON 08/17/2008  . ERECTILE DYSFUNCTION 06/30/2007  . GERD 06/30/2007  . GOUT 07/09/2010  . HAND PAIN, RIGHT 04/22/2010  . HYPERLIPIDEMIA 06/30/2007  . HYPERTENSION 08/17/2008  .  LEUKOCYTOSIS 10/16/2010  . Myalgia 11/12/2010  . OSTEOARTHRITIS, KNEE, LEFT 06/30/2007  . Pain in joint, multiple sites 10/16/2010  . PMR (polymyalgia rheumatica) ([place]) 05/14/2011  . SHOULDER PAIN, BILATERAL 07/09/2010  . WRIST PAIN, RIGHT 07/09/2010   Past Surgical History:  Procedure Laterality Date  . CATARACT EXTRACTION    . inguinal herniorrhapy    . s.p right knee arthroscopic     surgury in his 3's    reports that he has quit smoking. He has never used smokeless tobacco. He reports that he drinks alcohol. He reports that he does not use drugs. family history includes Dementia in his father and mother; Diabetes in his other; Gout in his brother and maternal grandfather; Parkinsonism in his other. Allergies  Allergen Reactions  . Hydrocodone Nausea Only   Current Outpatient Prescriptions on File Prior to Visit  Medication Sig Dispense Refill  . aspirin 81 MG tablet Take 81 mg by mouth daily.      Marland Kitchen atorvastatin (LIPITOR) 10 MG tablet TAKE 1 TABLET BY MOUTH DAILY AT 6 PM 90 tablet 1  . losartan (COZAAR) 50 MG tablet Take 1 tablet (50 mg total) by mouth daily. 90 tablet 1  . metFORMIN (GLUCOPHAGE-XR) 500 MG 24 hr tablet TAKE TWO TABLETS BY MOUTH DAILY 180 tablet 2  . metoprolol tartrate (LOPRESSOR) 25 MG tablet TAKE 1 TABLET BY MOUTH 2 (TWO) TIMES DAILY 180 tablet 2  . metroNIDAZOLE (METROGEL) 1 % gel Apply  topically daily. 45 g 1  . omeprazole (PRILOSEC) 20 MG capsule Take 1 capsule (20 mg total) by mouth daily. 90 capsule 3  . sildenafil (REVATIO) 20 MG tablet TAKE 3 TO 5 TABLETS BY MOUTH DAILY AS NEEDED 60 tablet 5   No current facility-administered medications on file prior to visit.    Review of Systems Constitutional: Negative for other unusual diaphoresis, sweats, appetite or weight changes HENT: Negative for other worsening hearing loss, ear pain, facial swelling, mouth sores or neck stiffness.   Eyes: Negative for other worsening pain, redness or other visual disturbance.    Respiratory: Negative for other stridor or swelling Cardiovascular: Negative for other palpitations or other chest pain  Gastrointestinal: Negative for worsening diarrhea or loose stools, blood in stool, distention or other pain Genitourinary: Negative for hematuria, flank pain or other change in urine volume.  Musculoskeletal: Negative for myalgias or other joint swelling.  Skin: Negative for other color change, or other wound or worsening drainage.  Neurological: Negative for other syncope or numbness. Hematological: Negative for other adenopathy or swelling Psychiatric/Behavioral: Negative for hallucinations, other worsening agitation, SI, self-injury, or new decreased concentration All other system neg per pt    Objective:   Physical Exam BP (!) 150/88   Pulse 86   Ht 5\' 6"  (1.676 m)   Wt 183 lb (83 kg)   SpO2 97%   BMI 29.54 kg/m  VS noted, overwt Constitutional: Pt is oriented to person, place, and time. Appears well-developed and well-nourished, in no significant distress and comfortable Head: Normocephalic and atraumatic  Eyes: Conjunctivae and EOM are normal. Pupils are equal, round, and reactive to light Right Ear: External ear normal without discharge Left Ear: External ear normal without discharge Nose: Nose without discharge or deformity Mouth/Throat: Oropharynx is without other ulcerations and moist  Neck: Normal range of motion. Neck supple. No JVD present. No tracheal deviation present or significant neck LA or mass Cardiovascular: Normal rate, regular rhythm, normal heart sounds and intact distal pulses.   Pulmonary/Chest: WOB normal and breath sounds without rales or wheezing  Abdominal: Soft. Bowel sounds are normal. NT. No HSM  Musculoskeletal: Normal range of motion. Exhibits no edema, has bilat knee bony deg changes Lymphadenopathy: Has no other cervical adenopathy.  Neurological: Pt is alert and oriented to person, place, and time. Pt has normal reflexes. No  cranial nerve deficit. Motor grossly intact, Gait intact Skin: Skin is warm and dry. No rash noted or new ulcerations Psychiatric:  Has normal mood and affect. Behavior is normal without agitation No other exam findings . Lab Results  Component Value Date   WBC 9.8 01/29/2017   HGB 13.2 01/29/2017   HCT 38.8 (L) 01/29/2017   PLT 196.0 01/29/2017   GLUCOSE 175 (H) 01/29/2017   CHOL 131 01/29/2017   TRIG 133.0 01/29/2017   HDL 44.20 01/29/2017   LDLDIRECT 111.3 01/20/2013   LDLCALC 60 01/29/2017   ALT 17 01/29/2017   AST 16 01/29/2017   NA 139 01/29/2017   K 4.3 01/29/2017   CL 104 01/29/2017   CREATININE 0.89 01/29/2017   BUN 23 01/29/2017   CO2 27 01/29/2017   TSH 1.30 01/29/2017   PSA 10.89 (H) 01/31/2016   HGBA1C 6.9 (H) 01/29/2017   MICROALBUR 0.8 01/29/2017       Assessment & Plan:

## 2017-02-03 NOTE — Assessment & Plan Note (Signed)
stable overall by history and exam, recent data reviewed with pt, and pt to continue medical treatment as before,  to f/u any worsening symptoms or concerns Lab Results  Component Value Date   HGBA1C 6.9 (H) 01/29/2017

## 2017-02-03 NOTE — Assessment & Plan Note (Signed)

## 2017-02-21 ENCOUNTER — Other Ambulatory Visit: Payer: Self-pay | Admitting: Internal Medicine

## 2017-03-18 DIAGNOSIS — R972 Elevated prostate specific antigen [PSA]: Secondary | ICD-10-CM | POA: Diagnosis not present

## 2017-03-24 DIAGNOSIS — R972 Elevated prostate specific antigen [PSA]: Secondary | ICD-10-CM | POA: Diagnosis not present

## 2017-03-24 DIAGNOSIS — R351 Nocturia: Secondary | ICD-10-CM | POA: Diagnosis not present

## 2017-03-24 DIAGNOSIS — N401 Enlarged prostate with lower urinary tract symptoms: Secondary | ICD-10-CM | POA: Diagnosis not present

## 2017-05-18 DIAGNOSIS — L570 Actinic keratosis: Secondary | ICD-10-CM | POA: Diagnosis not present

## 2017-05-18 DIAGNOSIS — L718 Other rosacea: Secondary | ICD-10-CM | POA: Diagnosis not present

## 2017-05-18 DIAGNOSIS — D225 Melanocytic nevi of trunk: Secondary | ICD-10-CM | POA: Diagnosis not present

## 2017-05-18 DIAGNOSIS — B353 Tinea pedis: Secondary | ICD-10-CM | POA: Diagnosis not present

## 2017-05-20 DIAGNOSIS — H17813 Minor opacity of cornea, bilateral: Secondary | ICD-10-CM | POA: Diagnosis not present

## 2017-05-20 DIAGNOSIS — Z961 Presence of intraocular lens: Secondary | ICD-10-CM | POA: Diagnosis not present

## 2017-05-20 DIAGNOSIS — Z947 Corneal transplant status: Secondary | ICD-10-CM | POA: Diagnosis not present

## 2017-06-01 ENCOUNTER — Other Ambulatory Visit: Payer: Self-pay | Admitting: Internal Medicine

## 2017-06-21 ENCOUNTER — Other Ambulatory Visit: Payer: Self-pay | Admitting: Internal Medicine

## 2017-07-24 ENCOUNTER — Other Ambulatory Visit: Payer: Self-pay | Admitting: Internal Medicine

## 2017-07-24 ENCOUNTER — Other Ambulatory Visit (INDEPENDENT_AMBULATORY_CARE_PROVIDER_SITE_OTHER): Payer: Medicare Other

## 2017-07-24 DIAGNOSIS — Z1159 Encounter for screening for other viral diseases: Secondary | ICD-10-CM

## 2017-07-24 DIAGNOSIS — E119 Type 2 diabetes mellitus without complications: Secondary | ICD-10-CM

## 2017-07-24 LAB — LIPID PANEL
CHOL/HDL RATIO: 3
CHOLESTEROL: 109 mg/dL (ref 0–200)
HDL: 41.9 mg/dL (ref >39.00)
LDL Cholesterol: 48 mg/dL (ref 0–99)
NonHDL: 66.99
TRIGLYCERIDES: 97 mg/dL (ref 0.0–149.0)
VLDL: 19.4 mg/dL (ref 0.0–40.0)

## 2017-07-24 LAB — BASIC METABOLIC PANEL
BUN: 23 mg/dL (ref 6–23)
CALCIUM: 9.2 mg/dL (ref 8.4–10.5)
CO2: 31 mEq/L (ref 19–32)
CREATININE: 0.91 mg/dL (ref 0.40–1.50)
Chloride: 100 mEq/L (ref 96–112)
GFR: 87.14 mL/min (ref >60.00)
Glucose, Bld: 195 mg/dL — ABNORMAL HIGH (ref 70–99)
Potassium: 4.7 mEq/L (ref 3.5–5.1)
Sodium: 137 mEq/L (ref 135–145)

## 2017-07-24 LAB — HEPATIC FUNCTION PANEL
ALT: 16 U/L (ref 0–53)
AST: 15 U/L (ref 0–37)
Albumin: 4.1 g/dL (ref 3.5–5.2)
Alkaline Phosphatase: 87 U/L (ref 39–117)
BILIRUBIN TOTAL: 0.6 mg/dL (ref 0.2–1.2)
Bilirubin, Direct: 0.1 mg/dL (ref 0.0–0.3)
Total Protein: 7.3 g/dL (ref 6.0–8.3)

## 2017-07-24 LAB — HEMOGLOBIN A1C: Hgb A1c MFr Bld: 7.7 % — ABNORMAL HIGH (ref 4.6–6.5)

## 2017-07-25 LAB — HEPATITIS C ANTIBODY
HEP C AB: NONREACTIVE
SIGNAL TO CUT-OFF: 0.02 (ref <1.00)

## 2017-07-28 ENCOUNTER — Ambulatory Visit: Payer: Medicare Other | Admitting: Internal Medicine

## 2017-07-28 ENCOUNTER — Encounter: Payer: Self-pay | Admitting: Internal Medicine

## 2017-07-28 VITALS — BP 124/86 | HR 65 | Temp 97.9°F | Ht 66.0 in | Wt 186.0 lb

## 2017-07-28 DIAGNOSIS — E785 Hyperlipidemia, unspecified: Secondary | ICD-10-CM | POA: Diagnosis not present

## 2017-07-28 DIAGNOSIS — E119 Type 2 diabetes mellitus without complications: Secondary | ICD-10-CM | POA: Diagnosis not present

## 2017-07-28 DIAGNOSIS — I10 Essential (primary) hypertension: Secondary | ICD-10-CM

## 2017-07-28 DIAGNOSIS — Z23 Encounter for immunization: Secondary | ICD-10-CM | POA: Diagnosis not present

## 2017-07-28 MED ORDER — SILDENAFIL CITRATE 20 MG PO TABS: ORAL_TABLET | ORAL | 4 refills | Status: AC

## 2017-07-28 MED ORDER — METFORMIN HCL ER 500 MG PO TB24: 1000.0000 mg | ORAL_TABLET | Freq: Every day | ORAL | 3 refills | Status: DC

## 2017-07-28 NOTE — Patient Instructions (Addendum)
Shirron will sign you up for the [facility] to increase the metformin back to 2 pills per day  Please continue all other medications as before, and refills have been done if requested.  Please have the pharmacy call with any other refills you may need.  Please continue your efforts at being more active, low cholesterol diabetic diet, and weight control.  Please keep your appointments with your specialists as you may have planned  Please return in 6 months, or sooner if needed, with Lab testing done 3-5 days before

## 2017-07-28 NOTE — Progress Notes (Signed)
Subjective:    Patient ID: Steve Collins, male    DOB: [birth year], 71 y.o.   MRN: [id number]  HPI  Here to f/u; overall doing ok,  Pt denies chest pain, increasing sob or doe, wheezing, orthopnea, PND, increased LE swelling, palpitations, dizziness or syncope.  Pt denies new neurological symptoms such as new headache, or facial or extremity weakness or numbness.  Pt denies polydipsia, polyuria, or low sugar episode.  Pt states overall good compliance with meds, mostly trying to follow appropriate diet, with wt overall stable,  but exercise however up 4-5 days per wk, mostly aerobic activity on a treadmill for 1 hr at about 3 mph.  Sometimes will do arm wts and other days legs and hips/.  Still declines colonoscopy, but now will do cologaurd Wt Readings from Last 3 Encounters:  07/28/17 186 lb (84.4 kg)  02/03/17 183 lb (83 kg)  08/05/16 186 lb (84.4 kg)   BP Readings from Last 3 Encounters:  07/28/17 124/86  02/03/17 (!) 150/88  08/05/16 136/82   Past Medical History:  Diagnosis Date  . CATARACT NOS 06/08/2007  . COLONIC POLYPS, HX OF 08/17/2008  . Coronary artery calcification seen on CAT scan 07/27/2012  . DIABETES MELLITUS, TYPE II 10/10/2009  . DIVERTICULOSIS, COLON 08/17/2008  . ERECTILE DYSFUNCTION 06/30/2007  . GERD 06/30/2007  . GOUT 07/09/2010  . HAND PAIN, RIGHT 04/22/2010  . HYPERLIPIDEMIA 06/30/2007  . HYPERTENSION 08/17/2008  . LEUKOCYTOSIS 10/16/2010  . Myalgia 11/12/2010  . OSTEOARTHRITIS, KNEE, LEFT 06/30/2007  . Pain in joint, multiple sites 10/16/2010  . PMR (polymyalgia rheumatica) ([place]) 05/14/2011  . SHOULDER PAIN, BILATERAL 07/09/2010  . WRIST PAIN, RIGHT 07/09/2010   Past Surgical History:  Procedure Laterality Date  . CATARACT EXTRACTION    . inguinal herniorrhapy    . s.p right knee arthroscopic     surgury in his 63's    reports that he has quit smoking. he has never used smokeless tobacco. He reports that he drinks alcohol. He reports that he does not use  drugs. family history includes Dementia in his father and mother; Diabetes in his other; Gout in his brother and maternal grandfather; Parkinsonism in his other. Allergies  Allergen Reactions  . Hydrocodone Nausea Only   Current Outpatient Medications on File Prior to Visit  Medication Sig Dispense Refill  . aspirin 81 MG tablet Take 81 mg by mouth daily.      Marland Kitchen atorvastatin (LIPITOR) 10 MG tablet TAKE 1 TABLET BY MOUTH DAILY AT 6 PM 90 tablet 1  . losartan (COZAAR) 50 MG tablet TAKE 1 TABLET(50 MG) BY MOUTH DAILY 90 tablet 2  . metFORMIN (GLUCOPHAGE-XR) 500 MG 24 hr tablet Take 1 tablet (500 mg total) by mouth daily with breakfast. 90 tablet 3  . metoprolol tartrate (LOPRESSOR) 25 MG tablet TAKE 1 TABLET BY MOUTH TWICE DAILY 180 tablet 2  . metroNIDAZOLE (METROGEL) 1 % gel Apply topically daily. 45 g 1  . omeprazole (PRILOSEC) 20 MG capsule Take 1 capsule (20 mg total) by mouth daily. 90 capsule 3  . sildenafil (REVATIO) 20 MG tablet TAKE 3 TO 5 TABLETS BY MOUTH DAILY AS NEEDED 60 tablet 4  . tamsulosin (FLOMAX) 0.4 MG CAPS capsule Take 1 capsule (0.4 mg total) by mouth daily. 30 capsule 11   No current facility-administered medications on file prior to visit.    Review of Systems  Constitutional: Negative for other unusual diaphoresis or sweats HENT: Negative for ear discharge or swelling Eyes:  Negative for other worsening visual disturbances Respiratory: Negative for stridor or other swelling  Gastrointestinal: Negative for worsening distension or other blood Genitourinary: Negative for retention or other urinary change Musculoskeletal: Negative for other MSK pain or swelling Skin: Negative for color change or other new lesions Neurological: Negative for worsening tremors and other numbness  Psychiatric/Behavioral: Negative for worsening agitation or other fatigue All other system per pt    Objective:   Physical Exam BP 124/86   Pulse 65   Temp 97.9 F (36.6 C) (Oral)   Ht  5\' 6"  (1.676 m)   Wt 186 lb (84.4 kg)   SpO2 98%   BMI 30.02 kg/m  VS noted,  Constitutional: Pt appears in NAD HENT: Head: NCAT.  Right Ear: External ear normal.  Left Ear: External ear normal.  Eyes: . Pupils are equal, round, and reactive to light. Conjunctivae and EOM are normal Nose: without d/c or deformity Neck: Neck supple. Gross normal ROM Cardiovascular: Normal rate and regular rhythm.   Pulmonary/Chest: Effort normal and breath sounds without rales or wheezing.  Abd:  Soft, NT, ND, + BS, no organomegaly Neurological: Pt is alert. At baseline orientation, motor grossly intact Skin: Skin is warm. No rashes, other new lesions, no LE edema Psychiatric: Pt behavior is normal without agitation  No other exam findings Lab Results  Component Value Date   WBC 9.8 01/29/2017   HGB 13.2 01/29/2017   HCT 38.8 (L) 01/29/2017   PLT 196.0 01/29/2017   GLUCOSE 195 (H) 07/24/2017   CHOL 109 07/24/2017   TRIG 97.0 07/24/2017   HDL 41.90 07/24/2017   LDLDIRECT 111.3 01/20/2013   LDLCALC 48 07/24/2017   ALT 16 07/24/2017   AST 15 07/24/2017   NA 137 07/24/2017   K 4.7 07/24/2017   CL 100 07/24/2017   CREATININE 0.91 07/24/2017   BUN 23 07/24/2017   CO2 31 07/24/2017   TSH 1.30 01/29/2017   PSA 10.89 (H) 01/31/2016   HGBA1C 7.7 (H) 07/24/2017   MICROALBUR 0.8 01/29/2017       Assessment & Plan:

## 2017-07-29 NOTE — Assessment & Plan Note (Signed)
Lab Results  Component Value Date   LDLCALC 48 07/24/2017  stable overall by history and exam, recent data reviewed with pt, and pt to continue medical treatment as before,  to f/u any worsening symptoms or concerns

## 2017-07-29 NOTE — Assessment & Plan Note (Signed)
stable overall by history and exam, recent data reviewed with pt, and pt to continue medical treatment as before,  to f/u any worsening symptoms or concerns BP Readings from Last 3 Encounters:  07/28/17 124/86  02/03/17 (!) 150/88  08/05/16 136/82

## 2017-07-29 NOTE — Assessment & Plan Note (Signed)
Lab Results  Component Value Date   HGBA1C 7.7 (H) 07/24/2017  stable overall by history and exam, recent data reviewed with pt, and pt to continue medical treatment as before,  to f/u any worsening symptoms or concerns

## 2017-08-17 DIAGNOSIS — Z1211 Encounter for screening for malignant neoplasm of colon: Secondary | ICD-10-CM | POA: Diagnosis not present

## 2017-08-17 DIAGNOSIS — Z1212 Encounter for screening for malignant neoplasm of rectum: Secondary | ICD-10-CM | POA: Diagnosis not present

## 2017-08-27 LAB — COLOGUARD: Cologuard: POSITIVE

## 2017-08-28 ENCOUNTER — Telehealth: Payer: Self-pay | Admitting: Internal Medicine

## 2017-08-28 DIAGNOSIS — R195 Other fecal abnormalities: Secondary | ICD-10-CM

## 2017-08-28 NOTE — Telephone Encounter (Signed)
OK to contact pt -   Cologuard test is POSITIVE, which means there is very likely a polyp that needs to be removed from the colon  We need to refer him to GI to consider further evaluation

## 2017-08-31 NOTE — Telephone Encounter (Signed)
Ok this is already done, thanks

## 2017-08-31 NOTE — Telephone Encounter (Signed)
Pt has been informed and expressed understanding. He would like for the referral to be sent upstairs because he has had a colonoscopy up there years ago.

## 2017-09-04 ENCOUNTER — Encounter: Payer: Self-pay | Admitting: Internal Medicine

## 2017-09-15 DIAGNOSIS — R972 Elevated prostate specific antigen [PSA]: Secondary | ICD-10-CM | POA: Diagnosis not present

## 2017-09-22 DIAGNOSIS — N400 Enlarged prostate without lower urinary tract symptoms: Secondary | ICD-10-CM | POA: Diagnosis not present

## 2017-09-22 DIAGNOSIS — R972 Elevated prostate specific antigen [PSA]: Secondary | ICD-10-CM | POA: Diagnosis not present

## 2017-10-06 DIAGNOSIS — N400 Enlarged prostate without lower urinary tract symptoms: Secondary | ICD-10-CM | POA: Diagnosis not present

## 2017-10-06 DIAGNOSIS — D075 Carcinoma in situ of prostate: Secondary | ICD-10-CM | POA: Diagnosis not present

## 2017-10-06 DIAGNOSIS — R972 Elevated prostate specific antigen [PSA]: Secondary | ICD-10-CM | POA: Diagnosis not present

## 2017-10-14 DIAGNOSIS — N4231 Prostatic intraepithelial neoplasia: Secondary | ICD-10-CM | POA: Diagnosis not present

## 2017-10-14 DIAGNOSIS — R972 Elevated prostate specific antigen [PSA]: Secondary | ICD-10-CM | POA: Diagnosis not present

## 2017-10-22 ENCOUNTER — Telehealth: Payer: Self-pay

## 2017-10-22 ENCOUNTER — Ambulatory Visit (AMBULATORY_SURGERY_CENTER): Payer: Self-pay

## 2017-10-22 VITALS — Ht 66.0 in | Wt 177.0 lb

## 2017-10-22 DIAGNOSIS — Z86010 Personal history of colonic polyps: Secondary | ICD-10-CM

## 2017-10-22 NOTE — Telephone Encounter (Signed)
Thank you for your help. Will proceed as scheduled! Gwyndolyn Saxon

## 2017-10-22 NOTE — Telephone Encounter (Signed)
Dr Carlean Purl, This pt is scheduled for his colon on 11/05/17 at [facility]. Pt had his last colon at [facility] on 03/16/2003. The pt does not know why he was scheduled at the hospital in 2004.  Please advise if pt is [place] for [employer]. Thanks. Gwyndolyn Saxon PV

## 2017-10-22 NOTE — Progress Notes (Signed)
Per pt, no allergies to soy or egg products.Pt not taking any weight loss meds or using  O2 at home.  Pt refused emmi video. 

## 2017-10-22 NOTE — Telephone Encounter (Signed)
As best I can tell I do not see any contraindication  Years ago we frequently did cases at the hospital and not because they had to be done there

## 2017-10-23 ENCOUNTER — Encounter: Payer: Self-pay | Admitting: Internal Medicine

## 2017-11-05 ENCOUNTER — Ambulatory Visit (AMBULATORY_SURGERY_CENTER): Payer: Medicare Other | Admitting: Internal Medicine

## 2017-11-05 ENCOUNTER — Other Ambulatory Visit: Payer: Self-pay

## 2017-11-05 ENCOUNTER — Encounter: Payer: Self-pay | Admitting: Internal Medicine

## 2017-11-05 VITALS — BP 128/58 | HR 56 | Temp 98.0°F | Resp 14 | Ht 66.0 in | Wt 177.0 lb

## 2017-11-05 DIAGNOSIS — K219 Gastro-esophageal reflux disease without esophagitis: Secondary | ICD-10-CM | POA: Diagnosis not present

## 2017-11-05 DIAGNOSIS — I10 Essential (primary) hypertension: Secondary | ICD-10-CM | POA: Diagnosis not present

## 2017-11-05 DIAGNOSIS — E119 Type 2 diabetes mellitus without complications: Secondary | ICD-10-CM | POA: Diagnosis not present

## 2017-11-05 DIAGNOSIS — K635 Polyp of colon: Secondary | ICD-10-CM

## 2017-11-05 DIAGNOSIS — D125 Benign neoplasm of sigmoid colon: Secondary | ICD-10-CM

## 2017-11-05 DIAGNOSIS — Z86010 Personal history of colonic polyps: Secondary | ICD-10-CM

## 2017-11-05 MED ORDER — SODIUM CHLORIDE 0.9 % IV SOLN: 500.0000 mL | Freq: Once | INTRAVENOUS | Status: DC

## 2017-11-05 NOTE — Op Note (Signed)
Steve Collins Procedure Date: 11/05/2017 9:36 AM MRN: [id number] Endoscopist: Gatha Mayer , MD Age: 72 Referring MD:  Date of Birth: 1946-03-11 Gender: Male Account #: [id number] Procedure:                Colonoscopy Indications:              Surveillance: Personal history of adenomatous                            polyps on last colonoscopy > 5 years ago Medicines:                Propofol per Anesthesia, Monitored Anesthesia Care Procedure:                Pre-Anesthesia Assessment:                           - Prior to the procedure, a History and Physical                            was performed, and patient medications and                            allergies were reviewed. The patient's tolerance of                            previous anesthesia was also reviewed. The risks                            and benefits of the procedure and the sedation                            options and risks were discussed with the patient.                            All questions were answered, and informed consent                            was obtained. Prior Anticoagulants: The patient has                            taken no previous anticoagulant or antiplatelet                            agents. ASA Grade Assessment: III - A patient with                            severe systemic disease. After reviewing the risks                            and benefits, the patient was deemed in                            satisfactory condition to undergo the procedure.  After obtaining informed consent, the colonoscope                            was passed under direct vision. Throughout the                            procedure, the patient's blood pressure, pulse, and                            oxygen saturations were monitored continuously. The                            Colonoscope was introduced through the anus and   advanced to the the cecum, identified by                            appendiceal orifice and ileocecal valve. The                            colonoscopy was performed without difficulty. The                            patient tolerated the procedure well. The quality                            of the bowel preparation was adequate. The                            ileocecal valve, appendiceal orifice, and rectum                            were photographed. The bowel preparation used was                            Miralax. Scope In: 9:45:57 AM Scope Out: 10:09:14 AM Scope Withdrawal Time: 0 hours 19 minutes 12 seconds  Total Procedure Duration: 0 hours 23 minutes 17 seconds  Findings:                 The perianal and digital rectal examinations were                            normal. Pertinent negatives include normal prostate                            (size, shape, and consistency).                           Four sessile polyps were found in the sigmoid                            colon. The polyps were 3 to 6 mm in size. These                            polyps were removed with a cold  snare. Resection                            and retrieval were complete. Verification of                            patient identification for the specimen was done.                            Estimated blood loss was minimal.                           The exam was otherwise without abnormality on                            direct and retroflexion views. Complications:            No immediate complications. Estimated Blood Loss:     Estimated blood loss was minimal. Impression:               - Four 3 to 6 mm polyps in the sigmoid colon,                            removed with a cold snare. Resected and retrieved.                           - The examination was otherwise normal on direct                            and retroflexion views.                           - Personal history of colonic polyp adenoma  2004. Recommendation:           - Patient has a contact number available for                            emergencies. The signs and symptoms of potential                            delayed complications were discussed with the                            patient. Return to normal activities tomorrow.                            Written discharge instructions were provided to the                            patient.                           - Resume previous diet.                           - Continue present medications.                           -  Repeat colonoscopy is recommended. The                            colonoscopy date will be determined after pathology                            results from today's exam become available for                            review. Gatha Mayer, MD 11/05/2017 10:19:53 AM This report has been signed electronically.

## 2017-11-05 NOTE — Patient Instructions (Addendum)
   I found and removed 4 small polyps. No signs of cancer.  You also have a condition called diverticulosis - common and not usually a problem. Please read the handout provided.  I appreciate the opportunity to care for you. Gatha Mayer, MD, [facility]  **Handouts given on polyps and diverticulosis**  YOU HAD AN ENDOSCOPIC PROCEDURE TODAY: Refer to the procedure report and other information in the discharge instructions given to you for any specific questions about what was found during the examination. If this information does not answer your questions, please call [place] office at [phone] to clarify.   YOU SHOULD EXPECT: Some feelings of bloating in the abdomen. Passage of more gas than usual. Walking can help get rid of the air that was put into your GI tract during the procedure and reduce the bloating. If you had a lower endoscopy (such as a colonoscopy or flexible sigmoidoscopy) you may notice spotting of blood in your stool or on the toilet paper. Some abdominal soreness may be present for a day or two, also.  DIET: Your first meal following the procedure should be a light meal and then it is ok to progress to your normal diet. A half-sandwich or bowl of soup is an example of a good first meal. Heavy or fried foods are harder to digest and may make you feel nauseous or bloated. Drink plenty of fluids but you should avoid alcoholic beverages for 24 hours. If you had a esophageal dilation, please see attached instructions for diet.    ACTIVITY: Your care partner should take you home directly after the procedure. You should plan to take it easy, moving slowly for the rest of the day. You can resume normal activity the day after the procedure however YOU SHOULD NOT DRIVE, use power tools, machinery or perform tasks that involve climbing or major physical exertion for 24 hours (because of the sedation medicines used during the test).   SYMPTOMS TO REPORT IMMEDIATELY: A  gastroenterologist can be reached at any hour. Please call [phone]  for any of the following symptoms:  Following lower endoscopy (colonoscopy, flexible sigmoidoscopy) Excessive amounts of blood in the stool  Significant tenderness, worsening of abdominal pains  Swelling of the abdomen that is new, acute  Fever of 100 or higher    FOLLOW UP:  If any biopsies were taken you will be contacted by phone or by letter within the next 1-3 weeks. Call [phone]  if you have not heard about the biopsies in 3 weeks.  Please also call with any specific questions about appointments or follow up tests.

## 2017-11-05 NOTE — Progress Notes (Signed)
Report to PACU, RN, vss, BBS= Clear.  

## 2017-11-05 NOTE — Progress Notes (Signed)
Pt's states no medical or surgical changes since previsit or office visit. 

## 2017-11-05 NOTE — Progress Notes (Signed)
Called to room to assist during endoscopic procedure.  Patient ID and intended procedure confirmed with present staff. Received instructions for my participation in the procedure from the performing physician.  

## 2017-11-06 ENCOUNTER — Telehealth: Payer: Self-pay

## 2017-11-06 NOTE — Telephone Encounter (Signed)
   Follow up Call-  Call back number 11/05/2017  Post procedure Call Back phone  # [phone]// [phone]  Permission to leave phone message Yes  Some recent data might be hidden     Patient questions:  Do you have a fever, pain , or abdominal swelling? No. Pain Score  0 *  Have you tolerated food without any problems? Yes.    Have you been able to return to your normal activities? Yes.    Do you have any questions about your discharge instructions: Diet   No. Medications  No. Follow up visit  No.  Do you have questions or concerns about your Care? No.  Actions: * If pain score is 4 or above: No action needed, pain <4.

## 2017-11-12 ENCOUNTER — Encounter: Payer: Self-pay | Admitting: Internal Medicine

## 2017-11-12 NOTE — Progress Notes (Signed)
Distal hyperplastic polyps No recall  My chart

## 2017-11-13 ENCOUNTER — Other Ambulatory Visit: Payer: Self-pay | Admitting: Internal Medicine

## 2017-11-27 DIAGNOSIS — Z961 Presence of intraocular lens: Secondary | ICD-10-CM | POA: Diagnosis not present

## 2017-11-27 DIAGNOSIS — H35342 Macular cyst, hole, or pseudohole, left eye: Secondary | ICD-10-CM | POA: Diagnosis not present

## 2017-11-27 DIAGNOSIS — H3322 Serous retinal detachment, left eye: Secondary | ICD-10-CM | POA: Diagnosis not present

## 2017-11-27 DIAGNOSIS — Z947 Corneal transplant status: Secondary | ICD-10-CM | POA: Diagnosis not present

## 2017-12-12 ENCOUNTER — Other Ambulatory Visit: Payer: Self-pay | Admitting: Internal Medicine

## 2018-01-22 ENCOUNTER — Other Ambulatory Visit (INDEPENDENT_AMBULATORY_CARE_PROVIDER_SITE_OTHER): Payer: Medicare Other

## 2018-01-22 ENCOUNTER — Telehealth: Payer: Self-pay

## 2018-01-22 DIAGNOSIS — E785 Hyperlipidemia, unspecified: Secondary | ICD-10-CM | POA: Diagnosis not present

## 2018-01-22 DIAGNOSIS — Z0000 Encounter for general adult medical examination without abnormal findings: Secondary | ICD-10-CM | POA: Diagnosis not present

## 2018-01-22 DIAGNOSIS — E119 Type 2 diabetes mellitus without complications: Secondary | ICD-10-CM

## 2018-01-22 LAB — URINALYSIS, ROUTINE W REFLEX MICROSCOPIC
Hgb urine dipstick: NEGATIVE
KETONES UR: NEGATIVE
LEUKOCYTES UA: NEGATIVE
Nitrite: NEGATIVE
PH: 5.5 (ref 5.0–8.0)
RBC / HPF: NONE SEEN (ref 0–?)
Specific Gravity, Urine: >=1.03 — AB (ref 1.000–1.030)
Total Protein, Urine: NEGATIVE
UROBILINOGEN UA: 0.2 (ref 0.0–1.0)
Urine Glucose: NEGATIVE
WBC UA: NONE SEEN (ref 0–?)

## 2018-01-22 LAB — CBC WITH DIFFERENTIAL/PLATELET
Basophils Absolute: 0 10*3/uL (ref 0.0–0.1)
Basophils Relative: 0.4 % (ref 0.0–3.0)
EOS PCT: 1.3 % (ref 0.0–5.0)
Eosinophils Absolute: 0.1 10*3/uL (ref 0.0–0.7)
HCT: 28.9 % — ABNORMAL LOW (ref 39.0–52.0)
Hemoglobin: 9.7 g/dL — ABNORMAL LOW (ref 13.0–17.0)
Lymphocytes Relative: 19.9 % (ref 12.0–46.0)
Lymphs Abs: 1.9 10*3/uL (ref 0.7–4.0)
MCHC: 33.5 g/dL (ref 30.0–36.0)
MCV: 86.4 fl (ref 78.0–100.0)
MONO ABS: 0.7 10*3/uL (ref 0.1–1.0)
MONOS PCT: 7.1 % (ref 3.0–12.0)
Neutro Abs: 6.9 10*3/uL (ref 1.4–7.7)
Neutrophils Relative %: 71.3 % (ref 43.0–77.0)
Platelets: 323 10*3/uL (ref 150.0–400.0)
RBC: 3.34 Mil/uL — ABNORMAL LOW (ref 4.22–5.81)
RDW: 16 % — ABNORMAL HIGH (ref 11.5–15.5)
WBC: 9.6 10*3/uL (ref 4.0–10.5)

## 2018-01-22 LAB — LIPID PANEL
CHOL/HDL RATIO: 2
Cholesterol: 102 mg/dL (ref 0–200)
HDL: 45.3 mg/dL (ref >39.00)
LDL Cholesterol: 41 mg/dL (ref 0–99)
NONHDL: 57.09
TRIGLYCERIDES: 82 mg/dL (ref 0.0–149.0)
VLDL: 16.4 mg/dL (ref 0.0–40.0)

## 2018-01-22 LAB — BASIC METABOLIC PANEL
BUN: 25 mg/dL — ABNORMAL HIGH (ref 6–23)
CALCIUM: 9.4 mg/dL (ref 8.4–10.5)
CO2: 26 mEq/L (ref 19–32)
Chloride: 103 mEq/L (ref 96–112)
Creatinine, Ser: 0.81 mg/dL (ref 0.40–1.50)
GFR: 99.53 mL/min (ref >60.00)
Glucose, Bld: 134 mg/dL — ABNORMAL HIGH (ref 70–99)
Potassium: 4 mEq/L (ref 3.5–5.1)
SODIUM: 139 meq/L (ref 135–145)

## 2018-01-22 LAB — HEPATIC FUNCTION PANEL
ALBUMIN: 3.4 g/dL — AB (ref 3.5–5.2)
ALT: 10 U/L (ref 0–53)
AST: 12 U/L (ref 0–37)
Alkaline Phosphatase: 86 U/L (ref 39–117)
Bilirubin, Direct: 0.1 mg/dL (ref 0.0–0.3)
Total Bilirubin: 0.4 mg/dL (ref 0.2–1.2)
Total Protein: 7.1 g/dL (ref 6.0–8.3)

## 2018-01-22 LAB — VITAMIN B12: Vitamin B-12: >1500 pg/mL — ABNORMAL HIGH (ref 211–911)

## 2018-01-22 LAB — IRON: Iron: 32 ug/dL — ABNORMAL LOW (ref 42–165)

## 2018-01-22 LAB — TSH: TSH: 0.88 u[IU]/mL (ref 0.35–4.50)

## 2018-01-22 LAB — HEMOGLOBIN A1C: Hgb A1c MFr Bld: 6.9 % — ABNORMAL HIGH (ref 4.6–6.5)

## 2018-01-22 NOTE — Telephone Encounter (Signed)
CPE orders needed to be entered

## 2018-01-26 ENCOUNTER — Encounter: Payer: Self-pay | Admitting: Internal Medicine

## 2018-01-26 ENCOUNTER — Ambulatory Visit: Payer: Medicare Other | Admitting: Internal Medicine

## 2018-01-26 VITALS — BP 116/72 | HR 71 | Temp 98.0°F | Ht 66.0 in | Wt 163.0 lb

## 2018-01-26 DIAGNOSIS — Z0001 Encounter for general adult medical examination with abnormal findings: Secondary | ICD-10-CM | POA: Diagnosis not present

## 2018-01-26 DIAGNOSIS — K219 Gastro-esophageal reflux disease without esophagitis: Secondary | ICD-10-CM

## 2018-01-26 DIAGNOSIS — R413 Other amnesia: Secondary | ICD-10-CM | POA: Insufficient documentation

## 2018-01-26 DIAGNOSIS — E119 Type 2 diabetes mellitus without complications: Secondary | ICD-10-CM | POA: Diagnosis not present

## 2018-01-26 DIAGNOSIS — D509 Iron deficiency anemia, unspecified: Secondary | ICD-10-CM | POA: Diagnosis not present

## 2018-01-26 DIAGNOSIS — Z23 Encounter for immunization: Secondary | ICD-10-CM

## 2018-01-26 DIAGNOSIS — M1710 Unilateral primary osteoarthritis, unspecified knee: Secondary | ICD-10-CM

## 2018-01-26 DIAGNOSIS — IMO0002 Reserved for concepts with insufficient information to code with codable children: Secondary | ICD-10-CM

## 2018-01-26 MED ORDER — POLYSACCHARIDE IRON COMPLEX 150 MG PO CAPS: 150.0000 mg | ORAL_CAPSULE | Freq: Every day | ORAL | 3 refills | Status: AC

## 2018-01-26 MED ORDER — ZOSTER VAC RECOMB ADJUVANTED 50 MCG/0.5ML IM SUSR: 0.5000 mL | Freq: Once | INTRAMUSCULAR | 1 refills | Status: AC

## 2018-01-26 MED ORDER — PANTOPRAZOLE SODIUM 40 MG PO TBEC: 40.0000 mg | DELAYED_RELEASE_TABLET | Freq: Every day | ORAL | 3 refills | Status: AC

## 2018-01-26 NOTE — Assessment & Plan Note (Signed)

## 2018-01-26 NOTE — Assessment & Plan Note (Signed)
New onset, somewhat disheveled today, slower cognitive and likely some mild cognitive impairment, declines further eval or tx for now given other issues

## 2018-01-26 NOTE — Progress Notes (Signed)
Subjective:    Patient ID: Steve Collins, male    DOB: Jan 19, 1946, 72 y.o.   MRN: [id number]  HPI  Here for wellness and f/u;  Overall doing ok;  Pt denies Chest pain, worsening SOB, DOE, wheezing, orthopnea, PND, worsening LE edema, palpitations, dizziness or syncope.  Pt denies neurological change such as new headache, facial or extremity weakness.  Pt denies polydipsia, polyuria, or low sugar symptoms. Pt states overall good compliance with treatment and medications, good tolerability, and has been trying to follow appropriate diet.  Pt denies worsening depressive symptoms, suicidal ideation or panic. No fever, night sweats, wt loss, loss of appetite, or other constitutional symptoms.  Pt states good ability with ADL's, has low fall risk, home safety reviewed and adequate, no other significant changes in hearing or vision, and only occasionally active with exercise.   Has had mild worsening reflux, without abd pain, dysphagia, n/v, bowel change or blood. Last colonoscopy just ma 2019 per Dr Carlean Purl without adenomatous polyp or other, no plan for f/u. Does not take PPI regularly.  Does take alleve fairly often Also, has some increased forgetfulness recently and mentions family has some complaints about his asking the same question more than once.  He suspects some cognitive slowing as well and has to think harder to get the coffee made in the AM  Brother now has early onset dementia as well. Past Medical History:  Diagnosis Date  . CATARACT NOS 06/08/2007  . COLONIC POLYPS, HX OF 08/17/2008  . Coronary artery calcification seen on CAT scan 07/27/2012  . DIABETES MELLITUS, TYPE II 10/10/2009  . DIVERTICULOSIS, COLON 08/17/2008  . ERECTILE DYSFUNCTION 06/30/2007  . Fracture, ribs 2006   due to fall down stairs  . GERD 06/30/2007  . GOUT 07/09/2010   in past/ no meds  . HAND PAIN, RIGHT 04/22/2010  . HYPERLIPIDEMIA 06/30/2007  . HYPERTENSION 08/17/2008  . LEUKOCYTOSIS 10/16/2010  . Myalgia 11/12/2010  .  OSTEOARTHRITIS, KNEE, LEFT 06/30/2007  . Pain in joint, multiple sites 10/16/2010  . PMR (polymyalgia rheumatica) ([place]) 05/14/2011  . SHOULDER PAIN, BILATERAL 07/09/2010  . WRIST PAIN, RIGHT 07/09/2010   Past Surgical History:  Procedure Laterality Date  . CATARACT EXTRACTION    . COLONOSCOPY    . HYDROCELE EXCISION / REPAIR     left testicular/ age 20 years old  . inguinal herniorrhapy     left side  . KNEE CARTILAGE SURGERY     left knee  . s.p right knee arthroscopic     surgury in his 32's  . SHOULDER ARTHROSCOPY     broken left shoulder  . UPPER GASTROINTESTINAL ENDOSCOPY      reports that he quit smoking about 20 years ago. He has never used smokeless tobacco. He reports that he drinks about 3.0 - 3.6 oz of alcohol per week. He reports that he does not use drugs. family history includes Dementia in his brother, father, and mother; Diabetes in his other; Gout in his brother and maternal grandfather; Parkinsonism in his other. Allergies  Allergen Reactions  . Hydrocodone Nausea Only   Current Outpatient Medications on File Prior to Visit  Medication Sig Dispense Refill  . atorvastatin (LIPITOR) 10 MG tablet TAKE 1 TABLET BY MOUTH DAILY AT 6 PM 90 tablet 0  . Cyanocobalamin (VITAMIN B 12 PO) Take 5,000 mg by mouth daily at 6 (six) AM.    . esomeprazole (NEXIUM) 20 MG packet Take 20 mg by mouth every other day.    Marland Kitchen  losartan (COZAAR) 50 MG tablet TAKE 1 TABLET(50 MG) BY MOUTH DAILY 90 tablet 2  . metFORMIN (GLUCOPHAGE-XR) 500 MG 24 hr tablet Take 2 tablets (1,000 mg total) by mouth daily with breakfast. 180 tablet 3  . metoprolol tartrate (LOPRESSOR) 25 MG tablet TAKE 1 TABLET BY MOUTH TWICE DAILY 180 tablet 0  . metroNIDAZOLE (METROGEL) 1 % gel Apply topically daily. 45 g 1  . Multiple Vitamins-Minerals (CENTRUM SILVER 50+MEN) TABS Take by mouth daily at 6 (six) AM.    . naproxen sodium (ALEVE) 220 MG tablet Take 220 mg by mouth. Take 1-2 tabs prn    . NON FORMULARY Move  Free Ultra True Natural Prostate Plus Health Complex- Take one pill daily    . omeprazole (PRILOSEC) 20 MG capsule Take 1 capsule (20 mg total) by mouth daily. 90 capsule 3  . sildenafil (REVATIO) 20 MG tablet TAKE 3 TO 5 TABLETS BY MOUTH DAILY AS NEEDED 60 tablet 4  . tamsulosin (FLOMAX) 0.4 MG CAPS capsule Take 1 capsule (0.4 mg total) by mouth daily. 30 capsule 11   No current facility-administered medications on file prior to visit.    Review of Systems Constitutional: Negative for other unusual diaphoresis, sweats, appetite or weight changes HENT: Negative for other worsening hearing loss, ear pain, facial swelling, mouth sores or neck stiffness.   Eyes: Negative for other worsening pain, redness or other visual disturbance.  Respiratory: Negative for other stridor or swelling Cardiovascular: Negative for other palpitations or other chest pain  Gastrointestinal: Negative for worsening diarrhea or loose stools, blood in stool, distention or other pain Genitourinary: Negative for hematuria, flank pain or other change in urine volume.  Musculoskeletal: Negative for myalgias or other joint swelling.  Skin: Negative for other color change, or other wound or worsening drainage.  Neurological: Negative for other syncope or numbness. Hematological: Negative for other adenopathy or swelling Psychiatric/Behavioral: Negative for hallucinations, other worsening agitation, SI, self-injury, or new decreased concentration All other system neg per pt    Objective:   Physical Exam BP 116/72   Pulse 71   Temp 98 F (36.7 C) (Oral)   Ht 5\' 6"  (1.676 m)   Wt 163 lb (73.9 kg)   SpO2 98%   BMI 26.31 kg/m  VS noted, slow movements and mild slower cognition Constitutional: Pt is oriented to person, place, and time. Appears well-developed and well-nourished, in no significant distress and comfortable Head: Normocephalic and atraumatic  Eyes: Conjunctivae and EOM are normal. Pupils are equal, round,  and reactive to light Right Ear: External ear normal without discharge Left Ear: External ear normal without discharge Nose: Nose without discharge or deformity Mouth/Throat: Oropharynx is without other ulcerations and moist  Neck: Normal range of motion. Neck supple. No JVD present. No tracheal deviation present or significant neck LA or mass Cardiovascular: Normal rate, regular rhythm, normal heart sounds and intact distal pulses.   Pulmonary/Chest: WOB normal and breath sounds without rales or wheezing  Abdominal: Soft. Bowel sounds are normal. NT. No HSM  Musculoskeletal: Normal range of motion. Exhibits no edema Lymphadenopathy: Has no other cervical adenopathy.  Neurological: Pt is alert and oriented to person, place, and time. Pt has normal reflexes. No cranial nerve deficit. Motor grossly intact, Gait intact Skin: Skin is warm and dry. No rash noted or new ulcerations Psychiatric:  Has normal mood and affect. Behavior is normal without agitation No other exam findings Lab Results  Component Value Date   WBC 9.6 01/22/2018  HGB 9.7 (L) 01/22/2018   HCT 28.9 (L) 01/22/2018   PLT 323.0 01/22/2018   GLUCOSE 134 (H) 01/22/2018   CHOL 102 01/22/2018   TRIG 82.0 01/22/2018   HDL 45.30 01/22/2018   LDLDIRECT 111.3 01/20/2013   LDLCALC 41 01/22/2018   ALT 10 01/22/2018   AST 12 01/22/2018   NA 139 01/22/2018   K 4.0 01/22/2018   CL 103 01/22/2018   CREATININE 0.81 01/22/2018   BUN 25 (H) 01/22/2018   CO2 26 01/22/2018   TSH 0.88 01/22/2018   PSA 10.89 (H) 01/31/2016   HGBA1C 6.9 (H) 01/22/2018   MICROALBUR 0.8 01/29/2017       Assessment & Plan:

## 2018-01-26 NOTE — Assessment & Plan Note (Addendum)
Etiology unclear, for Gi referral, d/c nsaids and otc PPI  In addition to the time spent performing CPE, I spent an additional 25 minutes face to face,in which greater than 50% of this time was spent in counseling and coordination of care for patient's acute illness as documented, including the differential dx, treatment, further evaluation and other management of iron def anemia, DM, GERD, DJD and memory loss

## 2018-01-26 NOTE — Assessment & Plan Note (Signed)
For protonix 40 qd ?

## 2018-01-26 NOTE — Assessment & Plan Note (Signed)
Ok for tylenol only prn,  to f/u any worsening symptoms or concerns

## 2018-01-26 NOTE — Patient Instructions (Addendum)
You had the Pneumovax pneumonia shot  Your [place] shot prescription was sent to the pharmacy  Please take all new medication as prescribed - the Nu-iron pills - one per day (and you may wish to take Colace as a stool softener with this, and be aware that iron pills can make your stool dark)  Please take all new medication as prescribed - the Protonix  OK to stop the prilosec or nexium otc you may be taking; and also please stop Alleve or al NSAID use  You will be contacted regarding the referral for: GI - Dr Carlean Purl  Please continue all other medications as before, and refills have been done if requested.  Please have the pharmacy call with any other refills you may need.  Please continue your efforts at being more active, low cholesterol diet, and weight control.  You are otherwise up to date with prevention measures today.  Please keep your appointments with your specialists as you may have planned  Please return in 6 months, or sooner if needed, with Lab testing done 3-5 days before

## 2018-01-26 NOTE — Assessment & Plan Note (Signed)
stable overall by history and exam, recent data reviewed with pt, and pt to continue medical treatment as before,  to f/u any worsening symptoms or concerns Lab Results  Component Value Date   HGBA1C 6.9 (H) 01/22/2018

## 2018-01-27 ENCOUNTER — Telehealth: Payer: Self-pay | Admitting: Internal Medicine

## 2018-01-27 NOTE — Telephone Encounter (Signed)
Copied from [place] [id number]. Topic: Quick Communication - See Telephone Encounter >> Jan 27, 2018 11:44 AM Cleaster Corin, NT wrote: CRM for notification. See Telephone encounter for: 01/27/18.  Pt. Calling stated that someone had called him but he deleted the vm by mistake and was wondering what the call is about pt. Can be reached at [phone]

## 2018-01-27 NOTE — Telephone Encounter (Signed)
Per referral tab, [facility] called patient to schedule. Called patient and informed. He will call them back to schedule.

## 2018-01-28 ENCOUNTER — Telehealth: Payer: Self-pay | Admitting: Internal Medicine

## 2018-01-28 DIAGNOSIS — I714 Abdominal aortic aneurysm, without rupture: Secondary | ICD-10-CM | POA: Diagnosis not present

## 2018-01-28 DIAGNOSIS — I10 Essential (primary) hypertension: Secondary | ICD-10-CM | POA: Diagnosis not present

## 2018-01-28 DIAGNOSIS — I723 Aneurysm of iliac artery: Secondary | ICD-10-CM | POA: Diagnosis not present

## 2018-01-28 DIAGNOSIS — Z95828 Presence of other vascular implants and grafts: Secondary | ICD-10-CM | POA: Diagnosis not present

## 2018-01-28 DIAGNOSIS — R911 Solitary pulmonary nodule: Secondary | ICD-10-CM | POA: Diagnosis not present

## 2018-01-28 DIAGNOSIS — R1909 Other intra-abdominal and pelvic swelling, mass and lump: Secondary | ICD-10-CM

## 2018-01-28 DIAGNOSIS — I716 Thoracoabdominal aortic aneurysm, without rupture: Secondary | ICD-10-CM | POA: Diagnosis not present

## 2018-01-28 NOTE — Telephone Encounter (Signed)
Pt has been informed and expressed understanding.  

## 2018-01-28 NOTE — Telephone Encounter (Signed)
Received CT result from Sonia Baller NP at [facility], [place].  CT scan revealed large soft tissue left side of abdomen- recommended tissue sampling. Per Sonia Baller NP radiologist was concerned for lymphoma, sarcoma and carcinoid.  Called PCP office and reported to [facility]. Routing note back to office high priority.

## 2018-01-28 NOTE — Telephone Encounter (Signed)
Ok to let pt know, we received his CT report from [facility], and I have ordered the CT biopsy which is the next step to see if anything serious.    He should hopefully hear soon

## 2018-01-28 NOTE — Telephone Encounter (Signed)
Hard copy placed on PCP desk

## 2018-01-28 NOTE — Telephone Encounter (Signed)
Will need hardcopy by fax to consider further evaluation, thanks

## 2018-02-01 ENCOUNTER — Telehealth: Payer: Self-pay | Admitting: Internal Medicine

## 2018-02-01 NOTE — Telephone Encounter (Signed)
T has been ordered. Does anything else need to be done?

## 2018-02-01 NOTE — Telephone Encounter (Signed)
Disc requested.

## 2018-02-01 NOTE — Telephone Encounter (Signed)
Copied from [place] [id number]. Topic: Quick Communication - See Telephone Encounter >> Feb 01, 2018  8:28 AM Boyd Kerbs wrote: CRM for notification. See Telephone encounter for: 02/01/18.  Pt went to see Dr. Vinnie Level at [facility] for a stint last Thursday 6/20, and they did a MRI and spotted a possible Tumor.  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  He was told to get this checked.  He was asking if Dr. Jenny Reichmann could refer him someone   Please call pt.  He has cleared his calendar for the next 2 weeks to get in.

## 2018-02-01 NOTE — Telephone Encounter (Signed)
Unfortunately we are only the middle managers in this situation  OK to try to obtain disc for use as required by local IR for CT biopsy, or pt will need to be directed back to [facility] for further management - just let me know

## 2018-02-01 NOTE — Telephone Encounter (Signed)
Dr John please advise.  

## 2018-02-02 ENCOUNTER — Other Ambulatory Visit: Payer: Self-pay | Admitting: Internal Medicine

## 2018-02-02 ENCOUNTER — Ambulatory Visit
Admission: RE | Admit: 2018-02-02 | Discharge: 2018-02-02 | Disposition: A | Discharge status: Home / Self Care | Payer: Self-pay | Source: Ambulatory Visit | Attending: Internal Medicine | Admitting: Internal Medicine

## 2018-02-02 DIAGNOSIS — R52 Pain, unspecified: Secondary | ICD-10-CM

## 2018-02-02 NOTE — Telephone Encounter (Signed)
Patient checking status, has disk be received?

## 2018-02-02 NOTE — Telephone Encounter (Signed)
Informed patient that discs were received this morning and that I hand delivered to the radiology department around 11:30am.

## 2018-02-02 NOTE — Telephone Encounter (Signed)
Noted. Thanks.

## 2018-02-08 ENCOUNTER — Other Ambulatory Visit: Payer: Self-pay | Admitting: Radiology

## 2018-02-09 ENCOUNTER — Other Ambulatory Visit: Payer: Self-pay | Admitting: Internal Medicine

## 2018-02-09 ENCOUNTER — Ambulatory Visit (HOSPITAL_COMMUNITY)
Admission: RE | Admit: 2018-02-09 | Discharge: 2018-02-09 | Disposition: A | Discharge status: Home / Self Care | Payer: Medicare Other | Source: Ambulatory Visit | Attending: Internal Medicine | Admitting: Internal Medicine

## 2018-02-09 ENCOUNTER — Encounter (HOSPITAL_COMMUNITY): Payer: Self-pay

## 2018-02-09 DIAGNOSIS — Z87891 Personal history of nicotine dependence: Secondary | ICD-10-CM | POA: Diagnosis not present

## 2018-02-09 DIAGNOSIS — M353 Polymyalgia rheumatica: Secondary | ICD-10-CM | POA: Insufficient documentation

## 2018-02-09 DIAGNOSIS — I714 Abdominal aortic aneurysm, without rupture: Secondary | ICD-10-CM | POA: Insufficient documentation

## 2018-02-09 DIAGNOSIS — C480 Malignant neoplasm of retroperitoneum: Secondary | ICD-10-CM | POA: Diagnosis not present

## 2018-02-09 DIAGNOSIS — E119 Type 2 diabetes mellitus without complications: Secondary | ICD-10-CM | POA: Diagnosis not present

## 2018-02-09 DIAGNOSIS — Z8489 Family history of other specified conditions: Secondary | ICD-10-CM | POA: Insufficient documentation

## 2018-02-09 DIAGNOSIS — M109 Gout, unspecified: Secondary | ICD-10-CM | POA: Insufficient documentation

## 2018-02-09 DIAGNOSIS — K219 Gastro-esophageal reflux disease without esophagitis: Secondary | ICD-10-CM | POA: Insufficient documentation

## 2018-02-09 DIAGNOSIS — Z7984 Long term (current) use of oral hypoglycemic drugs: Secondary | ICD-10-CM | POA: Insufficient documentation

## 2018-02-09 DIAGNOSIS — I10 Essential (primary) hypertension: Secondary | ICD-10-CM | POA: Diagnosis not present

## 2018-02-09 DIAGNOSIS — C762 Malignant neoplasm of abdomen: Secondary | ICD-10-CM | POA: Diagnosis not present

## 2018-02-09 DIAGNOSIS — R1909 Other intra-abdominal and pelvic swelling, mass and lump: Secondary | ICD-10-CM | POA: Diagnosis present

## 2018-02-09 DIAGNOSIS — I2510 Atherosclerotic heart disease of native coronary artery without angina pectoris: Secondary | ICD-10-CM | POA: Insufficient documentation

## 2018-02-09 DIAGNOSIS — Z79899 Other long term (current) drug therapy: Secondary | ICD-10-CM | POA: Diagnosis not present

## 2018-02-09 DIAGNOSIS — Z833 Family history of diabetes mellitus: Secondary | ICD-10-CM | POA: Diagnosis not present

## 2018-02-09 DIAGNOSIS — R1904 Left lower quadrant abdominal swelling, mass and lump: Secondary | ICD-10-CM | POA: Diagnosis not present

## 2018-02-09 DIAGNOSIS — Z9889 Other specified postprocedural states: Secondary | ICD-10-CM | POA: Insufficient documentation

## 2018-02-09 DIAGNOSIS — Z885 Allergy status to narcotic agent status: Secondary | ICD-10-CM | POA: Insufficient documentation

## 2018-02-09 DIAGNOSIS — E785 Hyperlipidemia, unspecified: Secondary | ICD-10-CM | POA: Insufficient documentation

## 2018-02-09 LAB — CBC
HCT: 30.2 % — ABNORMAL LOW (ref 39.0–52.0)
HEMOGLOBIN: 9.3 g/dL — AB (ref 13.0–17.0)
MCH: 27.4 pg (ref 26.0–34.0)
MCHC: 30.8 g/dL (ref 30.0–36.0)
MCV: 89.1 fL (ref 78.0–100.0)
PLATELETS: 328 10*3/uL (ref 150–400)
RBC: 3.39 MIL/uL — AB (ref 4.22–5.81)
RDW: 15.9 % — ABNORMAL HIGH (ref 11.5–15.5)
WBC: 11 10*3/uL — AB (ref 4.0–10.5)

## 2018-02-09 LAB — GLUCOSE, CAPILLARY
GLUCOSE-CAPILLARY: 114 mg/dL — AB (ref 70–99)
Glucose-Capillary: 136 mg/dL — ABNORMAL HIGH (ref 70–99)

## 2018-02-09 LAB — PROTIME-INR
INR: 1.31
PROTHROMBIN TIME: 16.1 s — AB (ref 11.4–15.2)

## 2018-02-09 MED ORDER — SODIUM CHLORIDE 0.9 % IV SOLN: INTRAVENOUS | Status: DC

## 2018-02-09 MED ORDER — LIDOCAINE HCL 1 % IJ SOLN
INTRAMUSCULAR | Status: AC
  Filled 2018-02-09: qty 20

## 2018-02-09 MED ORDER — FENTANYL CITRATE (PF) 100 MCG/2ML IJ SOLN
INTRAMUSCULAR | Status: AC | PRN
  Administered 2018-02-09: 50 ug via INTRAVENOUS
  Administered 2018-02-09: 25 ug via INTRAVENOUS

## 2018-02-09 MED ORDER — MIDAZOLAM HCL 2 MG/2ML IJ SOLN
INTRAMUSCULAR | Status: AC | PRN
  Administered 2018-02-09: 0.5 mg via INTRAVENOUS
  Administered 2018-02-09: 1 mg via INTRAVENOUS

## 2018-02-09 MED ORDER — FENTANYL CITRATE (PF) 100 MCG/2ML IJ SOLN
INTRAMUSCULAR | Status: AC
  Filled 2018-02-09: qty 2

## 2018-02-09 MED ORDER — MIDAZOLAM HCL 2 MG/2ML IJ SOLN
INTRAMUSCULAR | Status: AC
  Filled 2018-02-09: qty 2

## 2018-02-09 NOTE — Discharge Instructions (Signed)

## 2018-02-09 NOTE — Procedures (Signed)
  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  Dillard Cannon MD Main # [phone] Pager  [phone]

## 2018-02-09 NOTE — H&P (Signed)
Chief Complaint: Patient was seen in consultation today for abdominal mass biopsy at the request of John,James W  Referring Physician(s): Biagio Borg  Supervising Physician: Arne Cleveland  Patient Status: [facility] - Out-pt  History of Present Illness: Steve Collins is a 72 y.o. male   Pt has been followed by Vascular surgeon- Dr Sammuel Hines at [facility] for few yrs regarding AAA Noted in most recent CT a new Abdominal mass Outside imaging in PACS-- no report available  Pt has no Ca Hx Previous smoker Denies abd pain But does notice change in appetite and wt loss since January 2019   Request made for Abdominal mass biopsy per Dr Marshall Cork Imaging reviewed and approved with Dr Earleen Newport     Past Medical History:  Diagnosis Date  . CATARACT NOS 06/08/2007  . COLONIC POLYPS, HX OF 08/17/2008  . Coronary artery calcification seen on CAT scan 07/27/2012  . DIABETES MELLITUS, TYPE II 10/10/2009  . DIVERTICULOSIS, COLON 08/17/2008  . ERECTILE DYSFUNCTION 06/30/2007  . Fracture, ribs 2006   due to fall down stairs  . GERD 06/30/2007  . GOUT 07/09/2010   in past/ no meds  . HAND PAIN, RIGHT 04/22/2010  . HYPERLIPIDEMIA 06/30/2007  . HYPERTENSION 08/17/2008  . LEUKOCYTOSIS 10/16/2010  . Myalgia 11/12/2010  . OSTEOARTHRITIS, KNEE, LEFT 06/30/2007  . Pain in joint, multiple sites 10/16/2010  . PMR (polymyalgia rheumatica) ([place]) 05/14/2011  . SHOULDER PAIN, BILATERAL 07/09/2010  . WRIST PAIN, RIGHT 07/09/2010    Past Surgical History:  Procedure Laterality Date  . CATARACT EXTRACTION    . COLONOSCOPY    . HYDROCELE EXCISION / REPAIR     left testicular/ age 56 years old  . inguinal herniorrhapy     left side  . KNEE CARTILAGE SURGERY     left knee  . s.p right knee arthroscopic     surgury in his 11's  . SHOULDER ARTHROSCOPY     broken left shoulder  . UPPER GASTROINTESTINAL ENDOSCOPY      Allergies: Hydrocodone  Medications: Prior to Admission medications   Medication Sig Start  Date End Date Taking? Authorizing Provider  atorvastatin (LIPITOR) 10 MG tablet TAKE 1 TABLET BY MOUTH DAILY AT 6 PM 12/14/17  Yes Biagio Borg, MD  Cyanocobalamin (VITAMIN B 12 PO) Take 5,000 mg by mouth daily at 6 (six) AM.   Yes [provider]  iron polysaccharides (NU-IRON) 150 MG capsule Take 1 capsule (150 mg total) by mouth daily. 01/26/18  Yes Biagio Borg, MD  losartan (COZAAR) 50 MG tablet TAKE 1 TABLET(50 MG) BY MOUTH DAILY 06/02/17  Yes Biagio Borg, MD  metFORMIN (GLUCOPHAGE-XR) 500 MG 24 hr tablet Take 2 tablets (1,000 mg total) by mouth daily with breakfast. 07/28/17  Yes Biagio Borg, MD  metoprolol tartrate (LOPRESSOR) 25 MG tablet TAKE 1 TABLET BY MOUTH TWICE DAILY 11/13/17  Yes Biagio Borg, MD  metroNIDAZOLE (METROGEL) 1 % gel Apply topically daily. Patient taking differently: Apply 1 application topically daily.  01/26/13  Yes Biagio Borg, MD  Multiple Vitamins-Minerals (CENTRUM SILVER 50+MEN) TABS Take by mouth daily at 6 (six) AM.   Yes [provider]  naproxen sodium (ALEVE) 220 MG tablet Take 220 mg by mouth 2 (two) times daily with a meal.   Yes [provider]  NON FORMULARY Take 1 tablet by mouth daily. Move Free Ultra True Natural Prostate Plus Health Complex- Take one pill daily    Yes [provider]  pantoprazole (PROTONIX) 40 MG tablet Take 1 tablet (40 mg total) by mouth daily. 01/26/18  Yes Biagio Borg, MD  tamsulosin (FLOMAX) 0.4 MG CAPS capsule Take 1 capsule (0.4 mg total) by mouth daily. 02/03/17  Yes Biagio Borg, MD  sildenafil (REVATIO) 20 MG tablet TAKE 3 TO 5 TABLETS BY MOUTH DAILY AS NEEDED 07/28/17   Biagio Borg, MD     Family History  Problem Relation Age of Onset  . Diabetes Other   . Parkinsonism Other   . Dementia Mother   . Dementia Father   . Gout Brother   . Dementia Brother   . Gout Maternal Grandfather   . Colon cancer Neg Hx   . Esophageal cancer Neg Hx   . Stomach cancer Neg Hx   . Rectal  cancer Neg Hx     Social History   Socioeconomic History  . Marital status: Divorced    Spouse name: Not on file  . Number of children: 3  . Years of education: Not on file  . Highest education level: Not on file  Occupational History  . 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 Financial resource strain: Not on file  . Food insecurity:    Worry: Not on file    Inability: Not on file  . Transportation needs:    Medical: Not on file    Non-medical: Not on file  Tobacco Use  . Smoking status: Former Smoker    Last attempt to quit: 1999    Years since quitting: 20.5  . Smokeless tobacco: Never Used  Substance and Sexual Activity  . Alcohol use: Yes    Alcohol/week: 3.0 - 3.6 oz    Types: 5 - 6 Standard drinks or equivalent per week  . Drug use: No  . Sexual activity: Not on file  Lifestyle  . Physical activity:    Days per week: Not on file    Minutes per session: Not on file  . Stress: Not on file  Relationships  . Social connections:    Talks on phone: Not on file    Gets together: Not on file    Attends religious service: Not on file    Active member of club or organization: Not on file    Attends meetings of clubs or organizations: Not on file    Relationship status: Not on file  Other Topics Concern  . Not on file  Social History Narrative  . Not on file    Review of Systems: A 12 point ROS discussed and pertinent positives are indicated in the HPI above.  All other systems are negative.  Review of Systems  Constitutional: Positive for appetite change and unexpected weight change. Negative for activity change, fatigue and fever.  Respiratory: Negative for cough and shortness of breath.   Cardiovascular: Negative for chest pain.  Gastrointestinal: Positive for nausea. Negative for abdominal pain.  Neurological: Negative for weakness.  Psychiatric/Behavioral: Negative for behavioral problems and confusion.     Vital Signs: BP 137/68   Pulse 73   Temp 97.9 F (36.6 C) (Oral)   Ht 5\' 6"  (1.676 m)   Wt 152 lb (68.9 kg)   SpO2 100%   BMI 24.53 kg/m   Physical Exam  Constitutional: He is oriented to person, place, and time.  Cardiovascular: Normal rate, regular rhythm and normal heart sounds.  Pulmonary/Chest: Effort normal and breath sounds normal.  Abdominal: Soft. Bowel  sounds are normal. There is no tenderness.  Musculoskeletal: Normal range of motion.  Neurological: He is alert and oriented to person, place, and time.  Skin: Skin is warm and dry.  Psychiatric: He has a normal mood and affect. His behavior is normal. Judgment and thought content normal.  Nursing note and vitals reviewed.   Imaging: Ct Outside Films Chest  Result Date: 02/02/2018 This examination belongs to an outside facility and is stored here for comparison purposes only.  Contact the originating outside institution for any associated report or interpretation.   Labs:  CBC: Recent Labs    01/22/18 0807 02/09/18 0712  WBC 9.6 11.0*  HGB 9.7* 9.3*  HCT 28.9* 30.2*  PLT 323.0 328    COAGS: No results for input(s): INR, APTT in the last 8760 hours.  BMP: Recent Labs    07/24/17 0815 01/22/18 0807  NA 137 139  K 4.7 4.0  CL 100 103  CO2 31 26  GLUCOSE 195* 134*  BUN 23 25*  CALCIUM 9.2 9.4  CREATININE 0.91 0.81    LIVER FUNCTION TESTS: Recent Labs    07/24/17 0815 01/22/18 0807  BILITOT 0.6 0.4  AST 15 12  ALT 16 10  ALKPHOS 87 86  PROT 7.3 7.1  ALBUMIN 4.1 3.4*    TUMOR MARKERS: No results for input(s): AFPTM, CEA, CA199, CHROMGRNA in the last 8760 hours.  Assessment and Plan:  Known AAA--- followed by Dr Sammuel Hines in [place] abd mass Scheduled for biopsy Risks and benefits discussed with the patient including, but not limited to bleeding, infection, damage to adjacent structures or low yield requiring additional tests.  All of the patient's questions were answered, patient  is agreeable to proceed. Consent signed and in chart.   Thank you for this interesting consult.  I greatly enjoyed meeting Steve Collins and look forward to participating in their care.  A copy of this report was sent to the requesting provider on this date.  Electronically Signed: Lavonia Drafts, PA-C 02/09/2018, 7:32 AM   I spent a total of  30 Minutes   in face to face in clinical consultation, greater than 50% of which was counseling/coordinating care for abd mass bx

## 2018-02-15 ENCOUNTER — Telehealth: Payer: Self-pay | Admitting: Internal Medicine

## 2018-02-15 ENCOUNTER — Other Ambulatory Visit: Payer: Self-pay | Admitting: Internal Medicine

## 2018-02-15 ENCOUNTER — Telehealth: Payer: Self-pay

## 2018-02-15 DIAGNOSIS — C494 Malignant neoplasm of connective and soft tissue of abdomen: Secondary | ICD-10-CM

## 2018-02-15 NOTE — Telephone Encounter (Signed)
Copied from [place] [id number]. Topic: Quick Communication - See Telephone Encounter >> Feb 15, 2018 11:13 AM Synthia Innocent wrote: CRM for notification. See Telephone encounter for: 02/15/18. Would like to speak with someone regarding next steps after CT bx. Please advise. Appointment needed?

## 2018-02-15 NOTE — Telephone Encounter (Signed)
-----   Message from Biagio Borg, MD sent at 02/15/2018  2:47 PM EDT ----- Left message on MyChart, pt to cont same tx except  The test results show that your current treatment is OK, except the pathology is consistent with a type of malignancy closely like Sarcoma.  We will need to refer you to Oncology asap, as any further evaluation or treatment will be needed from there.      Victorya Hillman to please inform pt, I will do referral

## 2018-02-15 NOTE — Telephone Encounter (Signed)
Pt has been informed and expressed understanding.  

## 2018-02-15 NOTE — Telephone Encounter (Signed)
Pathology report must be delayed due to the holidays.  Next step depends on the result, so OV for lab result by itself would not be helpful   We can see him if he has symptoms that need to be treated.  Otherwise, We will try to let him know the results of the pathology when able.

## 2018-02-18 ENCOUNTER — Inpatient Hospital Stay: Payer: Medicare Other

## 2018-02-18 ENCOUNTER — Inpatient Hospital Stay: Payer: Medicare Other | Attending: Hematology & Oncology | Admitting: Hematology & Oncology

## 2018-02-18 ENCOUNTER — Other Ambulatory Visit: Payer: Self-pay

## 2018-02-18 ENCOUNTER — Encounter: Payer: Self-pay | Admitting: Hematology & Oncology

## 2018-02-18 VITALS — BP 119/61 | HR 76 | Temp 98.1°F | Resp 16 | Wt 147.0 lb

## 2018-02-18 DIAGNOSIS — Z79899 Other long term (current) drug therapy: Secondary | ICD-10-CM | POA: Insufficient documentation

## 2018-02-18 DIAGNOSIS — Z7984 Long term (current) use of oral hypoglycemic drugs: Secondary | ICD-10-CM | POA: Insufficient documentation

## 2018-02-18 DIAGNOSIS — Z8719 Personal history of other diseases of the digestive system: Secondary | ICD-10-CM | POA: Diagnosis not present

## 2018-02-18 DIAGNOSIS — E785 Hyperlipidemia, unspecified: Secondary | ICD-10-CM | POA: Diagnosis not present

## 2018-02-18 DIAGNOSIS — M5383 Other specified dorsopathies, cervicothoracic region: Secondary | ICD-10-CM

## 2018-02-18 DIAGNOSIS — E119 Type 2 diabetes mellitus without complications: Secondary | ICD-10-CM | POA: Diagnosis not present

## 2018-02-18 DIAGNOSIS — Z86010 Personal history of colonic polyps: Secondary | ICD-10-CM | POA: Diagnosis not present

## 2018-02-18 DIAGNOSIS — I10 Essential (primary) hypertension: Secondary | ICD-10-CM | POA: Diagnosis not present

## 2018-02-18 DIAGNOSIS — M353 Polymyalgia rheumatica: Secondary | ICD-10-CM | POA: Diagnosis not present

## 2018-02-18 DIAGNOSIS — C481 Malignant neoplasm of specified parts of peritoneum: Secondary | ICD-10-CM

## 2018-02-18 DIAGNOSIS — I2510 Atherosclerotic heart disease of native coronary artery without angina pectoris: Secondary | ICD-10-CM | POA: Insufficient documentation

## 2018-02-18 DIAGNOSIS — Z87891 Personal history of nicotine dependence: Secondary | ICD-10-CM

## 2018-02-18 DIAGNOSIS — Z9181 History of falling: Secondary | ICD-10-CM

## 2018-02-18 DIAGNOSIS — K219 Gastro-esophageal reflux disease without esophagitis: Secondary | ICD-10-CM | POA: Diagnosis not present

## 2018-02-18 DIAGNOSIS — Z8781 Personal history of (healed) traumatic fracture: Secondary | ICD-10-CM | POA: Diagnosis not present

## 2018-02-18 DIAGNOSIS — I714 Abdominal aortic aneurysm, without rupture: Secondary | ICD-10-CM | POA: Insufficient documentation

## 2018-02-18 DIAGNOSIS — R1900 Intra-abdominal and pelvic swelling, mass and lump, unspecified site: Secondary | ICD-10-CM

## 2018-02-18 LAB — CBC WITH DIFFERENTIAL (CANCER CENTER ONLY)
Basophils Absolute: 0 10*3/uL (ref 0.0–0.1)
Basophils Relative: 0 %
Eosinophils Absolute: 0.1 10*3/uL (ref 0.0–0.5)
Eosinophils Relative: 1 %
HEMATOCRIT: 29.5 % — AB (ref 38.7–49.9)
HEMOGLOBIN: 9.2 g/dL — AB (ref 13.0–17.1)
LYMPHS PCT: 21 %
Lymphs Abs: 2.3 10*3/uL (ref 0.9–3.3)
MCH: 27.9 pg — ABNORMAL LOW (ref 28.0–33.4)
MCHC: 31.2 g/dL — AB (ref 32.0–35.9)
MCV: 89.4 fL (ref 82.0–98.0)
MONO ABS: 0.9 10*3/uL (ref 0.1–0.9)
MONOS PCT: 8 %
NEUTROS ABS: 7.6 10*3/uL — AB (ref 1.5–6.5)
Neutrophils Relative %: 70 %
Platelet Count: 377 10*3/uL (ref 145–400)
RBC: 3.3 MIL/uL — ABNORMAL LOW (ref 4.20–5.70)
RDW: 16.1 % — AB (ref 11.1–15.7)
WBC Count: 11 10*3/uL — ABNORMAL HIGH (ref 4.0–10.0)

## 2018-02-18 LAB — CMP (CANCER CENTER ONLY)
ALK PHOS: 89 U/L — AB (ref 26–84)
ALT: 16 U/L (ref 10–47)
ANION GAP: 10 (ref 5–15)
AST: 22 U/L (ref 11–38)
Albumin: 2.8 g/dL — ABNORMAL LOW (ref 3.5–5.0)
BILIRUBIN TOTAL: 0.5 mg/dL (ref 0.2–1.6)
BUN: 27 mg/dL — ABNORMAL HIGH (ref 7–22)
CALCIUM: 9.3 mg/dL (ref 8.0–10.3)
CO2: 29 mmol/L (ref 18–33)
Chloride: 100 mmol/L (ref 98–108)
Creatinine: 0.7 mg/dL (ref 0.60–1.20)
Glucose, Bld: 103 mg/dL (ref 73–118)
Potassium: 4.7 mmol/L (ref 3.3–4.7)
Sodium: 139 mmol/L (ref 128–145)
TOTAL PROTEIN: 7.3 g/dL (ref 6.4–8.1)

## 2018-02-18 NOTE — Progress Notes (Signed)
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 New Patient (Initial Visit)  : I have a tumor in my abdomen  HPI: Mr. Steve Collins is a very nice 72 year old white male.  He has non-insulin dependent diabetes.  He is on metformin for this.  He apparently fell down some stairs several years ago.  He broke several bones.  He broke ribs.  He broke his left shoulder.  He was sent to [facility].  He had scans done.  At the time, he was found to have an abdominal aortic aneurysm.  This was being followed every couple years by CT scans.  On a routine scan that he had done in June 2019, there was noted to be a soft tissue mass in the left hemiabdomen measuring 9.2 x 9.1 cm.  There is no vessel displacement or obstruction.  The mass abuts the infrarenal abdominal aorta as well as small bowel loops.  He was then sent for a biopsy.  This was done on 02/09/2018.  The pathology report ([id number]) showed a poorly differentiated sarcomatoid malignancy.  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  No other findings were appreciated.  Of note, when he had the CT scan done in June.at [facility].  This showed a unchanged 9 mm nodule along the right minor fissure.  He was kindly referred to the [facility] center for an evaluation.  He has lost weight.  He probably has lost about 40 pounds.  This is been a partially intentional weight loss.  He has had no nausea or vomiting.  He has had no change in bowel or bladder habits.  He has had no leg swelling.  He has had no cough.  There is no history of cancer in the family.  He smoked for about 33 years.  The most he smoked was 2 packs/day.  He stopped in 2001.  He has an occasional alcoholic beverage.  He has had no problems with bleeding.  He has had no headache.  He has had no swallowing issues.  Overall, his performance status is ECOG  1.     Past Medical History:  Diagnosis Date  . CATARACT NOS 06/08/2007  . COLONIC POLYPS, HX OF 08/17/2008  . Coronary artery calcification seen on CAT scan 07/27/2012  . DIABETES MELLITUS, TYPE II 10/10/2009  . DIVERTICULOSIS, COLON 08/17/2008  . ERECTILE DYSFUNCTION 06/30/2007  . Fracture, ribs 2006   due to fall down stairs  . GERD 06/30/2007  . GOUT 07/09/2010   in past/ no meds  . HAND PAIN, RIGHT 04/22/2010  . HYPERLIPIDEMIA 06/30/2007  . HYPERTENSION 08/17/2008  . LEUKOCYTOSIS 10/16/2010  . Myalgia 11/12/2010  . OSTEOARTHRITIS, KNEE, LEFT 06/30/2007  . Pain in joint, multiple sites 10/16/2010  . PMR (polymyalgia rheumatica) ([place]) 05/14/2011  . SHOULDER PAIN, BILATERAL 07/09/2010  . WRIST PAIN, RIGHT 07/09/2010  :  Past Surgical History:  Procedure Laterality Date  . CATARACT EXTRACTION    . COLONOSCOPY    . HYDROCELE EXCISION / REPAIR     left testicular/ age 76 years old  . inguinal herniorrhapy     left side  . KNEE CARTILAGE SURGERY     left knee  . s.p right knee arthroscopic     surgury in his 1's  . SHOULDER ARTHROSCOPY     broken left shoulder  . UPPER GASTROINTESTINAL ENDOSCOPY    :  Current Outpatient Medications:  .  atorvastatin (LIPITOR) 10 MG tablet, TAKE 1 TABLET BY MOUTH DAILY AT 6 PM, Disp: 90 tablet, Rfl: 0 .  Cyanocobalamin (VITAMIN B 12 PO), Take 5,000 mg by mouth daily at 6 (six) AM., Disp: , Rfl:  .  iron polysaccharides (NU-IRON) 150 MG capsule, Take 1 capsule (150 mg total) by mouth daily., Disp: 90 capsule, Rfl: 3 .  losartan (COZAAR) 50 MG tablet, TAKE 1 TABLET(50 MG) BY MOUTH DAILY, Disp: 90 tablet, Rfl: 2 .  metFORMIN (GLUCOPHAGE-XR) 500 MG 24 hr tablet, Take 2 tablets (1,000 mg total) by mouth daily with breakfast., Disp: 180 tablet, Rfl: 3 .  metoprolol tartrate (LOPRESSOR) 25 MG tablet, TAKE 1 TABLET BY MOUTH TWICE DAILY, Disp: 180 tablet, Rfl: 3 .  metroNIDAZOLE (METROGEL) 1 % gel, Apply topically daily. (Patient taking differently:  Apply 1 application topically daily. ), Disp: 45 g, Rfl: 1 .  Multiple Vitamins-Minerals (CENTRUM SILVER 50+MEN) TABS, Take by mouth daily at 6 (six) AM., Disp: , Rfl:  .  naproxen sodium (ALEVE) 220 MG tablet, Take 220 mg by mouth 2 (two) times daily with a meal., Disp: , Rfl:  .  NON FORMULARY, Take 1 tablet by mouth daily. Move Free Ultra True Natural Prostate Plus Health Complex- Take one pill daily , Disp: , Rfl:  .  pantoprazole (PROTONIX) 40 MG tablet, Take 1 tablet (40 mg total) by mouth daily., Disp: 90 tablet, Rfl: 3 .  sildenafil (REVATIO) 20 MG tablet, TAKE 3 TO 5 TABLETS BY MOUTH DAILY AS NEEDED, Disp: 60 tablet, Rfl: 4 .  tamsulosin (FLOMAX) 0.4 MG CAPS capsule, Take 1 capsule (0.4 mg total) by mouth daily., Disp: 30 capsule, Rfl: 11:  :  Allergies  Allergen Reactions  . Hydrocodone Nausea Only  :  Family History  Problem Relation Age of Onset  . Diabetes Other   . Parkinsonism Other   . Dementia Mother   . Dementia Father   . Gout Brother   . Dementia Brother   . Gout Maternal Grandfather   . Colon cancer Neg Hx   . Esophageal cancer Neg Hx   . Stomach cancer Neg Hx   . Rectal cancer Neg Hx   :  Social History   Socioeconomic History  . Marital status: Divorced    Spouse name: Not on file  . Number of children: 3  . Years of education: Not on file  . Highest education level: Not on file  Occupational History  . 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 Financial resource strain: Not on file  . Food insecurity:    Worry: Not on file    Inability: Not on file  . Transportation needs:    Medical: Not on file    Non-medical: Not on file  Tobacco Use  . Smoking status: Former Smoker    Last attempt to quit: 1999    Years since quitting: 20.5  . Smokeless tobacco: Never Used  Substance and Sexual Activity  . Alcohol use: Yes    Alcohol/week: 3.0 - 3.6 oz    Types: 5 - 6 Standard drinks or equivalent per week   . Drug use: No  . Sexual activity: Not on file  Lifestyle  . Physical activity:    Days per week: Not on file    Minutes per session: Not on file  . Stress: Not on file  Relationships  . Social connections:  Talks on phone: Not on file    Gets together: Not on file    Attends religious service: Not on file    Active member of club or organization: Not on file    Attends meetings of clubs or organizations: Not on file    Relationship status: Not on file  . Intimate partner violence:    Fear of current or ex partner: Not on file    Emotionally abused: Not on file    Physically abused: Not on file    Forced sexual activity: Not on file  Other Topics Concern  . Not on file  Social History Narrative  . Not on file  :  Review of Systems  Constitutional: Positive for weight loss.  HENT: Negative.   Eyes: Negative.   Respiratory: Negative.   Cardiovascular: Negative.   Gastrointestinal: Negative.   Genitourinary: Negative.   Musculoskeletal: Negative.   Skin: Negative.   Neurological: Negative.   Endo/Heme/Allergies: Negative.   Psychiatric/Behavioral: Negative.      Exam: Fairly well-developed and well-nourished white male in no obvious distress.  Vital signs show temperature of 98.1.  Pulse 76.  Blood pressure 119/61.  Weight is 147 pounds.  Head neck exam shows no ocular or oral lesions.  There are no palpable cervical or supra clavicular lymph nodes.  Lungs are clear bilaterally.  Cardiac exam regular rate and rhythm with no murmurs, rubs or bruits.  Abdomen is soft.  He does have a little bit of fullness in the left upper abdomen.  This is just above the umbilicus.  He has some slight tenderness to palpation in this area.  There is no palpable fluid wave.  He has no liver or spleen tip.  He has no inguinal adenopathy bilaterally.  Back exam shows no tenderness over the spine, ribs or hips.  Extremities shows no clubbing, cyanosis or edema.  He has good range of motion of  his joints.  Skin exam shows no rashes, ecchymoses or petechia.  Neurological exam shows no focal neurological deficits. @IPVITALS @   Recent Labs    02/18/18 1405  WBC 11.0*  HGB 9.2*  HCT 29.5*  PLT 377   Recent Labs    02/18/18 1405  NA 139  K 4.7  CL 100  CO2 29  GLUCOSE 103  BUN 27*  CREATININE 0.70  CALCIUM 9.3    Blood smear review: None  Pathology: See above    Assessment and Plan: Mr. Hain is a 72 year old white male.  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  He needs to have an MRI done.  I think this will give [place] a better idea as to the spatial relationships that this tumor has with the other organs/vessels in the abdomen.  Since he had a CT scan of the chest done at [facility], I do not think we have to repeat this.  I think the goal now is clearly to get this tumor resected.  It is fairly large.  I would think that this could be resected.  I know it might be difficult to fully know this unless you go in and look at it at the time of surgery.  I suspect that if he can have surgery, he will need to have adjuvant radiation therapy.  I spent about an hour with he and his 2 children.  They are all very nice.  I spent all the time face-to-face with them.  I reviewed the CT scan  results.  I explained my recommendation for surgery.  I really think that surgery has to be the primary mode of therapy.  I will call the surgeon tomorrow and try to get Mr. Kraai seen next week.  We will see about getting the MRI by this weekend.  I will not schedule a follow-up appointment yet until I know what is happened with surgery.  If, he cannot have surgery, I probably would consider referral to an academic [facility] as Mr. Narula would need combination radiation and chemotherapy.

## 2018-02-19 LAB — LACTATE DEHYDROGENASE: LDH: 165 U/L (ref 98–192)

## 2018-02-20 ENCOUNTER — Ambulatory Visit (HOSPITAL_BASED_OUTPATIENT_CLINIC_OR_DEPARTMENT_OTHER)
Admission: RE | Admit: 2018-02-20 | Discharge: 2018-02-20 | Disposition: A | Discharge status: Home / Self Care | Payer: Medicare Other | Source: Ambulatory Visit | Attending: Hematology & Oncology | Admitting: Hematology & Oncology

## 2018-02-20 DIAGNOSIS — I714 Abdominal aortic aneurysm, without rupture: Secondary | ICD-10-CM | POA: Diagnosis not present

## 2018-02-20 DIAGNOSIS — R1900 Intra-abdominal and pelvic swelling, mass and lump, unspecified site: Secondary | ICD-10-CM | POA: Diagnosis not present

## 2018-02-20 MED ORDER — GADOBENATE DIMEGLUMINE 529 MG/ML IV SOLN
15.0000 mL | Freq: Once | INTRAVENOUS | Status: AC | PRN
  Administered 2018-02-20: 14 mL via INTRAVENOUS

## 2018-02-24 ENCOUNTER — Other Ambulatory Visit: Payer: Self-pay | Admitting: Internal Medicine

## 2018-02-24 DIAGNOSIS — E119 Type 2 diabetes mellitus without complications: Secondary | ICD-10-CM | POA: Diagnosis not present

## 2018-02-24 DIAGNOSIS — C494 Malignant neoplasm of connective and soft tissue of abdomen: Secondary | ICD-10-CM | POA: Diagnosis not present

## 2018-02-24 DIAGNOSIS — I10 Essential (primary) hypertension: Secondary | ICD-10-CM | POA: Diagnosis not present

## 2018-03-04 DIAGNOSIS — C494 Malignant neoplasm of connective and soft tissue of abdomen: Secondary | ICD-10-CM | POA: Diagnosis not present

## 2018-03-10 DIAGNOSIS — C480 Malignant neoplasm of retroperitoneum: Secondary | ICD-10-CM | POA: Diagnosis not present

## 2018-03-11 ENCOUNTER — Other Ambulatory Visit: Payer: Self-pay | Admitting: Internal Medicine

## 2018-03-22 DIAGNOSIS — C480 Malignant neoplasm of retroperitoneum: Secondary | ICD-10-CM | POA: Diagnosis not present

## 2018-03-22 DIAGNOSIS — I714 Abdominal aortic aneurysm, without rupture: Secondary | ICD-10-CM | POA: Diagnosis not present

## 2018-03-25 DIAGNOSIS — C480 Malignant neoplasm of retroperitoneum: Secondary | ICD-10-CM | POA: Diagnosis not present

## 2018-03-25 DIAGNOSIS — Z01818 Encounter for other preprocedural examination: Secondary | ICD-10-CM | POA: Diagnosis not present

## 2018-04-01 DIAGNOSIS — C480 Malignant neoplasm of retroperitoneum: Secondary | ICD-10-CM | POA: Diagnosis not present

## 2018-04-01 DIAGNOSIS — R9431 Abnormal electrocardiogram [ECG] [EKG]: Secondary | ICD-10-CM | POA: Diagnosis not present

## 2018-04-01 DIAGNOSIS — E785 Hyperlipidemia, unspecified: Secondary | ICD-10-CM | POA: Diagnosis not present

## 2018-04-01 DIAGNOSIS — I959 Hypotension, unspecified: Secondary | ICD-10-CM | POA: Diagnosis not present

## 2018-04-01 DIAGNOSIS — R279 Unspecified lack of coordination: Secondary | ICD-10-CM | POA: Diagnosis not present

## 2018-04-01 DIAGNOSIS — R451 Restlessness and agitation: Secondary | ICD-10-CM | POA: Diagnosis not present

## 2018-04-01 DIAGNOSIS — I714 Abdominal aortic aneurysm, without rupture: Secondary | ICD-10-CM | POA: Diagnosis not present

## 2018-04-01 DIAGNOSIS — G8918 Other acute postprocedural pain: Secondary | ICD-10-CM | POA: Diagnosis not present

## 2018-04-01 DIAGNOSIS — Z743 Need for continuous supervision: Secondary | ICD-10-CM | POA: Diagnosis not present

## 2018-04-01 DIAGNOSIS — I10 Essential (primary) hypertension: Secondary | ICD-10-CM | POA: Diagnosis not present

## 2018-04-01 DIAGNOSIS — H3320 Serous retinal detachment, unspecified eye: Secondary | ICD-10-CM | POA: Diagnosis not present

## 2018-04-01 DIAGNOSIS — Z466 Encounter for fitting and adjustment of urinary device: Secondary | ICD-10-CM | POA: Diagnosis not present

## 2018-04-01 DIAGNOSIS — R000 Tachycardia, unspecified: Secondary | ICD-10-CM | POA: Diagnosis not present

## 2018-04-02 DIAGNOSIS — I10 Essential (primary) hypertension: Secondary | ICD-10-CM | POA: Diagnosis not present

## 2018-04-02 DIAGNOSIS — E785 Hyperlipidemia, unspecified: Secondary | ICD-10-CM | POA: Diagnosis not present

## 2018-04-02 DIAGNOSIS — C480 Malignant neoplasm of retroperitoneum: Secondary | ICD-10-CM | POA: Diagnosis not present

## 2018-04-02 DIAGNOSIS — H3320 Serous retinal detachment, unspecified eye: Secondary | ICD-10-CM | POA: Diagnosis not present

## 2018-04-03 DIAGNOSIS — R451 Restlessness and agitation: Secondary | ICD-10-CM | POA: Diagnosis not present

## 2018-04-03 DIAGNOSIS — H3320 Serous retinal detachment, unspecified eye: Secondary | ICD-10-CM | POA: Diagnosis not present

## 2018-04-03 DIAGNOSIS — I10 Essential (primary) hypertension: Secondary | ICD-10-CM | POA: Diagnosis not present

## 2018-04-03 DIAGNOSIS — C480 Malignant neoplasm of retroperitoneum: Secondary | ICD-10-CM | POA: Diagnosis not present

## 2018-04-04 DIAGNOSIS — H3320 Serous retinal detachment, unspecified eye: Secondary | ICD-10-CM | POA: Diagnosis not present

## 2018-04-04 DIAGNOSIS — R9431 Abnormal electrocardiogram [ECG] [EKG]: Secondary | ICD-10-CM | POA: Diagnosis not present

## 2018-04-04 DIAGNOSIS — I10 Essential (primary) hypertension: Secondary | ICD-10-CM | POA: Diagnosis not present

## 2018-04-04 DIAGNOSIS — C480 Malignant neoplasm of retroperitoneum: Secondary | ICD-10-CM | POA: Diagnosis not present

## 2018-04-04 DIAGNOSIS — R000 Tachycardia, unspecified: Secondary | ICD-10-CM | POA: Diagnosis not present

## 2018-04-04 DIAGNOSIS — R451 Restlessness and agitation: Secondary | ICD-10-CM | POA: Diagnosis not present

## 2018-04-05 DIAGNOSIS — I10 Essential (primary) hypertension: Secondary | ICD-10-CM | POA: Diagnosis not present

## 2018-04-05 DIAGNOSIS — H3320 Serous retinal detachment, unspecified eye: Secondary | ICD-10-CM | POA: Diagnosis not present

## 2018-04-05 DIAGNOSIS — R451 Restlessness and agitation: Secondary | ICD-10-CM | POA: Diagnosis not present

## 2018-04-05 DIAGNOSIS — C480 Malignant neoplasm of retroperitoneum: Secondary | ICD-10-CM | POA: Diagnosis not present

## 2018-04-14 DIAGNOSIS — R339 Retention of urine, unspecified: Secondary | ICD-10-CM | POA: Diagnosis not present

## 2018-04-14 DIAGNOSIS — I959 Hypotension, unspecified: Secondary | ICD-10-CM | POA: Diagnosis not present

## 2018-04-14 DIAGNOSIS — Z483 Aftercare following surgery for neoplasm: Secondary | ICD-10-CM | POA: Diagnosis not present

## 2018-04-14 DIAGNOSIS — R279 Unspecified lack of coordination: Secondary | ICD-10-CM | POA: Diagnosis not present

## 2018-04-14 DIAGNOSIS — Z466 Encounter for fitting and adjustment of urinary device: Secondary | ICD-10-CM | POA: Diagnosis not present

## 2018-04-14 DIAGNOSIS — R600 Localized edema: Secondary | ICD-10-CM | POA: Diagnosis not present

## 2018-04-14 DIAGNOSIS — C480 Malignant neoplasm of retroperitoneum: Secondary | ICD-10-CM | POA: Diagnosis not present

## 2018-04-14 DIAGNOSIS — E11319 Type 2 diabetes mellitus with unspecified diabetic retinopathy without macular edema: Secondary | ICD-10-CM | POA: Diagnosis not present

## 2018-04-14 DIAGNOSIS — Z743 Need for continuous supervision: Secondary | ICD-10-CM | POA: Diagnosis not present

## 2018-04-14 DIAGNOSIS — N400 Enlarged prostate without lower urinary tract symptoms: Secondary | ICD-10-CM | POA: Diagnosis not present

## 2018-04-14 DIAGNOSIS — K219 Gastro-esophageal reflux disease without esophagitis: Secondary | ICD-10-CM | POA: Diagnosis not present

## 2018-04-14 DIAGNOSIS — I714 Abdominal aortic aneurysm, without rupture: Secondary | ICD-10-CM | POA: Diagnosis not present

## 2018-04-14 MED ORDER — THIAMINE HCL 100 MG PO TABS
100.00 | ORAL_TABLET | ORAL | Status: DC
Start: 2018-04-15 — End: 2018-04-14

## 2018-04-14 MED ORDER — CYCLOBENZAPRINE HCL 5 MG PO TABS
5.00 | ORAL_TABLET | ORAL | Status: DC
Start: ? — End: 2018-04-14

## 2018-04-14 MED ORDER — METOPROLOL TARTRATE 25 MG PO TABS
25.00 | ORAL_TABLET | ORAL | Status: DC
Start: 2018-04-14 — End: 2018-04-14

## 2018-04-14 MED ORDER — GENERIC EXTERNAL MEDICATION
1.00 | Status: DC
Start: 2018-04-15 — End: 2018-04-14

## 2018-04-14 MED ORDER — TAMSULOSIN HCL 0.4 MG PO CAPS
0.40 | ORAL_CAPSULE | ORAL | Status: DC
Start: 2018-04-15 — End: 2018-04-14

## 2018-04-14 MED ORDER — LIDOCAINE 5 % EX PTCH
1.00 | MEDICATED_PATCH | CUTANEOUS | Status: DC
Start: 2018-04-14 — End: 2018-04-14

## 2018-04-14 MED ORDER — ATORVASTATIN CALCIUM 10 MG PO TABS
10.00 | ORAL_TABLET | ORAL | Status: DC
Start: 2018-04-15 — End: 2018-04-14

## 2018-04-14 MED ORDER — DIPHENHYDRAMINE HCL 25 MG PO CAPS
25.00 | ORAL_CAPSULE | ORAL | Status: DC
Start: ? — End: 2018-04-14

## 2018-04-14 MED ORDER — DEXTROSE 10 % IV SOLN
125.00 | INTRAVENOUS | Status: DC
Start: ? — End: 2018-04-14

## 2018-04-14 MED ORDER — GENERIC EXTERNAL MEDICATION
5.00 | Status: DC
Start: ? — End: 2018-04-14

## 2018-04-14 MED ORDER — GENERIC EXTERNAL MEDICATION
Status: DC
Start: ? — End: 2018-04-14

## 2018-04-14 MED ORDER — SENNOSIDES-DOCUSATE SODIUM 8.6-50 MG PO TABS
1.00 | ORAL_TABLET | ORAL | Status: DC
Start: 2018-04-14 — End: 2018-04-14

## 2018-04-14 MED ORDER — INSULIN LISPRO 100 UNIT/ML ~~LOC~~ SOLN
2.00 | SUBCUTANEOUS | Status: DC
Start: 2018-04-14 — End: 2018-04-14

## 2018-04-14 MED ORDER — FOLIC ACID 1 MG PO TABS
1.00 | ORAL_TABLET | ORAL | Status: DC
Start: 2018-04-15 — End: 2018-04-14

## 2018-04-14 MED ORDER — TRAMADOL HCL 50 MG PO TABS
50.00 | ORAL_TABLET | ORAL | Status: DC
Start: ? — End: 2018-04-14

## 2018-04-14 MED ORDER — ONDANSETRON 4 MG PO TBDP
4.00 | ORAL_TABLET | ORAL | Status: DC
Start: ? — End: 2018-04-14

## 2018-04-14 MED ORDER — POLYETHYL GLYCOL-PROPYL GLYCOL 0.4-0.3 % OP SOLN
1.00 | OPHTHALMIC | Status: DC
Start: ? — End: 2018-04-14

## 2018-04-14 MED ORDER — PANTOPRAZOLE SODIUM 40 MG PO TBEC
40.00 | DELAYED_RELEASE_TABLET | ORAL | Status: DC
Start: 2018-04-15 — End: 2018-04-14

## 2018-04-14 MED ORDER — ENOXAPARIN SODIUM 40 MG/0.4ML ~~LOC~~ SOLN
40.00 | SUBCUTANEOUS | Status: DC
Start: 2018-04-15 — End: 2018-04-14

## 2018-04-16 DIAGNOSIS — K219 Gastro-esophageal reflux disease without esophagitis: Secondary | ICD-10-CM | POA: Diagnosis not present

## 2018-04-16 DIAGNOSIS — N400 Enlarged prostate without lower urinary tract symptoms: Secondary | ICD-10-CM | POA: Diagnosis not present

## 2018-04-16 DIAGNOSIS — I714 Abdominal aortic aneurysm, without rupture: Secondary | ICD-10-CM | POA: Diagnosis not present

## 2018-04-16 DIAGNOSIS — C480 Malignant neoplasm of retroperitoneum: Secondary | ICD-10-CM | POA: Diagnosis not present

## 2018-04-20 DIAGNOSIS — R600 Localized edema: Secondary | ICD-10-CM | POA: Diagnosis not present

## 2018-04-20 DIAGNOSIS — E11319 Type 2 diabetes mellitus with unspecified diabetic retinopathy without macular edema: Secondary | ICD-10-CM | POA: Diagnosis not present

## 2018-04-20 DIAGNOSIS — C480 Malignant neoplasm of retroperitoneum: Secondary | ICD-10-CM | POA: Diagnosis not present

## 2018-04-22 DIAGNOSIS — R339 Retention of urine, unspecified: Secondary | ICD-10-CM | POA: Diagnosis not present

## 2018-04-23 ENCOUNTER — Telehealth: Payer: Self-pay | Admitting: Internal Medicine

## 2018-04-23 DIAGNOSIS — R600 Localized edema: Secondary | ICD-10-CM | POA: Diagnosis not present

## 2018-04-23 DIAGNOSIS — E11319 Type 2 diabetes mellitus with unspecified diabetic retinopathy without macular edema: Secondary | ICD-10-CM | POA: Diagnosis not present

## 2018-04-23 DIAGNOSIS — R339 Retention of urine, unspecified: Secondary | ICD-10-CM | POA: Diagnosis not present

## 2018-04-23 DIAGNOSIS — I714 Abdominal aortic aneurysm, without rupture: Secondary | ICD-10-CM | POA: Diagnosis not present

## 2018-04-23 DIAGNOSIS — C480 Malignant neoplasm of retroperitoneum: Secondary | ICD-10-CM | POA: Diagnosis not present

## 2018-04-23 DIAGNOSIS — Z466 Encounter for fitting and adjustment of urinary device: Secondary | ICD-10-CM | POA: Diagnosis not present

## 2018-04-23 NOTE — Telephone Encounter (Signed)
Steve Collins has been informed that PCP will follow the orders

## 2018-04-23 NOTE — Telephone Encounter (Signed)
Copied from [place] [id number]. Topic: Quick Communication - See Telephone Encounter >> Apr 23, 2018  2:59 PM Blase Mess A wrote: CRM for notification. See Telephone encounter for: 04/23/18. [place] wanted to verify orders for the patient that is discharge Steve Collins returning home on 04/24/18.  Orders are for PT & OT [place] 04/26/18.  Wanting to know will Dr Jenny Reichmann be following the orders?  Please advise Please Call Lavella Lemons [phone].

## 2018-04-24 DIAGNOSIS — Z483 Aftercare following surgery for neoplasm: Secondary | ICD-10-CM | POA: Diagnosis not present

## 2018-04-28 ENCOUNTER — Telehealth: Payer: Self-pay | Admitting: Internal Medicine

## 2018-04-28 NOTE — Telephone Encounter (Signed)
Verbal orders given via VM 

## 2018-04-28 NOTE — Telephone Encounter (Signed)
Copied from [place] [id number]. Topic: Quick Communication - See Telephone Encounter >> Apr 28, 2018 11:58 AM Sheran Luz wrote: CRM for notification. See Telephone encounter for: 04/28/18.  Radovan OT with [facility] requesting verbal orders OT frequency: 1w1, 2w2, 1w2.   [phone] OK to leave VM

## 2018-05-07 ENCOUNTER — Telehealth: Payer: Self-pay | Admitting: Internal Medicine

## 2018-05-07 NOTE — Telephone Encounter (Signed)
Ok for verbals if needed 

## 2018-05-07 NOTE — Telephone Encounter (Signed)
Copied from [place] [id number]. Topic: Quick Communication - See Telephone Encounter >> May 07, 2018  1:34 PM Conception Chancy, NT wrote: CRM for notification. See Telephone encounter for: 05/07/18.  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 and states the patient is missing OT this week and he seen his Oncologist and received some bad news and wants to continue with OT next week.   Cb# [phone]

## 2018-05-07 NOTE — Telephone Encounter (Signed)
Notified Radovan w/MD response../lmb 

## 2018-05-11 DIAGNOSIS — C480 Malignant neoplasm of retroperitoneum: Secondary | ICD-10-CM | POA: Diagnosis not present

## 2018-05-12 DIAGNOSIS — R339 Retention of urine, unspecified: Secondary | ICD-10-CM | POA: Diagnosis not present

## 2018-05-12 DIAGNOSIS — N400 Enlarged prostate without lower urinary tract symptoms: Secondary | ICD-10-CM | POA: Diagnosis not present

## 2018-05-12 DIAGNOSIS — N200 Calculus of kidney: Secondary | ICD-10-CM | POA: Diagnosis not present

## 2018-05-18 DIAGNOSIS — C480 Malignant neoplasm of retroperitoneum: Secondary | ICD-10-CM | POA: Diagnosis not present

## 2018-05-20 DIAGNOSIS — C494 Malignant neoplasm of connective and soft tissue of abdomen: Secondary | ICD-10-CM | POA: Diagnosis not present

## 2018-05-27 DIAGNOSIS — C480 Malignant neoplasm of retroperitoneum: Secondary | ICD-10-CM | POA: Diagnosis not present

## 2018-06-02 DIAGNOSIS — C480 Malignant neoplasm of retroperitoneum: Secondary | ICD-10-CM | POA: Diagnosis not present

## 2018-06-03 DIAGNOSIS — C494 Malignant neoplasm of connective and soft tissue of abdomen: Secondary | ICD-10-CM | POA: Diagnosis not present

## 2018-06-07 DIAGNOSIS — C494 Malignant neoplasm of connective and soft tissue of abdomen: Secondary | ICD-10-CM | POA: Diagnosis not present

## 2018-06-07 DIAGNOSIS — Z510 Encounter for antineoplastic radiation therapy: Secondary | ICD-10-CM | POA: Diagnosis not present

## 2018-06-08 DIAGNOSIS — I10 Essential (primary) hypertension: Secondary | ICD-10-CM | POA: Diagnosis not present

## 2018-06-08 DIAGNOSIS — C480 Malignant neoplasm of retroperitoneum: Secondary | ICD-10-CM | POA: Diagnosis not present

## 2018-06-08 DIAGNOSIS — Z510 Encounter for antineoplastic radiation therapy: Secondary | ICD-10-CM | POA: Diagnosis not present

## 2018-06-08 DIAGNOSIS — C499 Malignant neoplasm of connective and soft tissue, unspecified: Secondary | ICD-10-CM | POA: Diagnosis not present

## 2018-06-08 DIAGNOSIS — E119 Type 2 diabetes mellitus without complications: Secondary | ICD-10-CM | POA: Diagnosis not present

## 2018-06-08 DIAGNOSIS — C494 Malignant neoplasm of connective and soft tissue of abdomen: Secondary | ICD-10-CM | POA: Diagnosis not present

## 2018-06-09 DIAGNOSIS — C494 Malignant neoplasm of connective and soft tissue of abdomen: Secondary | ICD-10-CM | POA: Diagnosis not present

## 2018-06-09 DIAGNOSIS — Z510 Encounter for antineoplastic radiation therapy: Secondary | ICD-10-CM | POA: Diagnosis not present

## 2018-06-10 DIAGNOSIS — Z510 Encounter for antineoplastic radiation therapy: Secondary | ICD-10-CM | POA: Diagnosis not present

## 2018-06-10 DIAGNOSIS — C494 Malignant neoplasm of connective and soft tissue of abdomen: Secondary | ICD-10-CM | POA: Diagnosis not present

## 2018-06-11 DIAGNOSIS — C494 Malignant neoplasm of connective and soft tissue of abdomen: Secondary | ICD-10-CM | POA: Diagnosis not present

## 2018-06-11 DIAGNOSIS — Z510 Encounter for antineoplastic radiation therapy: Secondary | ICD-10-CM | POA: Diagnosis not present

## 2018-06-14 DIAGNOSIS — Z510 Encounter for antineoplastic radiation therapy: Secondary | ICD-10-CM | POA: Diagnosis not present

## 2018-06-14 DIAGNOSIS — C494 Malignant neoplasm of connective and soft tissue of abdomen: Secondary | ICD-10-CM | POA: Diagnosis not present

## 2018-06-15 DIAGNOSIS — C494 Malignant neoplasm of connective and soft tissue of abdomen: Secondary | ICD-10-CM | POA: Diagnosis not present

## 2018-06-15 DIAGNOSIS — Z510 Encounter for antineoplastic radiation therapy: Secondary | ICD-10-CM | POA: Diagnosis not present

## 2018-06-16 DIAGNOSIS — C494 Malignant neoplasm of connective and soft tissue of abdomen: Secondary | ICD-10-CM | POA: Diagnosis not present

## 2018-06-16 DIAGNOSIS — Z510 Encounter for antineoplastic radiation therapy: Secondary | ICD-10-CM | POA: Diagnosis not present

## 2018-06-17 DIAGNOSIS — Z510 Encounter for antineoplastic radiation therapy: Secondary | ICD-10-CM | POA: Diagnosis not present

## 2018-06-17 DIAGNOSIS — C494 Malignant neoplasm of connective and soft tissue of abdomen: Secondary | ICD-10-CM | POA: Diagnosis not present

## 2018-06-18 DIAGNOSIS — S79911A Unspecified injury of right hip, initial encounter: Secondary | ICD-10-CM | POA: Diagnosis not present

## 2018-06-18 DIAGNOSIS — E876 Hypokalemia: Secondary | ICD-10-CM | POA: Diagnosis not present

## 2018-06-18 DIAGNOSIS — R748 Abnormal levels of other serum enzymes: Secondary | ICD-10-CM | POA: Diagnosis not present

## 2018-06-18 DIAGNOSIS — R55 Syncope and collapse: Secondary | ICD-10-CM | POA: Diagnosis not present

## 2018-06-18 DIAGNOSIS — M2550 Pain in unspecified joint: Secondary | ICD-10-CM | POA: Diagnosis not present

## 2018-06-18 DIAGNOSIS — R531 Weakness: Secondary | ICD-10-CM | POA: Diagnosis not present

## 2018-06-18 DIAGNOSIS — I959 Hypotension, unspecified: Secondary | ICD-10-CM | POA: Diagnosis not present

## 2018-06-18 DIAGNOSIS — I10 Essential (primary) hypertension: Secondary | ICD-10-CM | POA: Diagnosis not present

## 2018-06-18 DIAGNOSIS — M25551 Pain in right hip: Secondary | ICD-10-CM | POA: Diagnosis not present

## 2018-06-18 DIAGNOSIS — E119 Type 2 diabetes mellitus without complications: Secondary | ICD-10-CM | POA: Diagnosis not present

## 2018-06-18 DIAGNOSIS — D649 Anemia, unspecified: Secondary | ICD-10-CM | POA: Diagnosis not present

## 2018-06-18 DIAGNOSIS — R9431 Abnormal electrocardiogram [ECG] [EKG]: Secondary | ICD-10-CM | POA: Diagnosis not present

## 2018-06-18 DIAGNOSIS — E785 Hyperlipidemia, unspecified: Secondary | ICD-10-CM | POA: Diagnosis not present

## 2018-06-18 DIAGNOSIS — R7989 Other specified abnormal findings of blood chemistry: Secondary | ICD-10-CM | POA: Diagnosis not present

## 2018-06-18 DIAGNOSIS — R29898 Other symptoms and signs involving the musculoskeletal system: Secondary | ICD-10-CM | POA: Diagnosis not present

## 2018-06-18 DIAGNOSIS — C494 Malignant neoplasm of connective and soft tissue of abdomen: Secondary | ICD-10-CM | POA: Diagnosis not present

## 2018-06-18 DIAGNOSIS — Z510 Encounter for antineoplastic radiation therapy: Secondary | ICD-10-CM | POA: Diagnosis not present

## 2018-06-18 DIAGNOSIS — R262 Difficulty in walking, not elsewhere classified: Secondary | ICD-10-CM | POA: Diagnosis not present

## 2018-06-18 DIAGNOSIS — W19XXXA Unspecified fall, initial encounter: Secondary | ICD-10-CM | POA: Diagnosis not present

## 2018-06-18 DIAGNOSIS — R000 Tachycardia, unspecified: Secondary | ICD-10-CM | POA: Diagnosis not present

## 2018-06-18 DIAGNOSIS — S0990XA Unspecified injury of head, initial encounter: Secondary | ICD-10-CM | POA: Diagnosis not present

## 2018-06-18 DIAGNOSIS — Z7401 Bed confinement status: Secondary | ICD-10-CM | POA: Diagnosis not present

## 2018-06-19 DIAGNOSIS — R262 Difficulty in walking, not elsewhere classified: Secondary | ICD-10-CM | POA: Diagnosis not present

## 2018-06-19 DIAGNOSIS — E119 Type 2 diabetes mellitus without complications: Secondary | ICD-10-CM | POA: Diagnosis not present

## 2018-06-19 DIAGNOSIS — R531 Weakness: Secondary | ICD-10-CM | POA: Diagnosis not present

## 2018-06-19 DIAGNOSIS — E785 Hyperlipidemia, unspecified: Secondary | ICD-10-CM | POA: Diagnosis not present

## 2018-06-20 DIAGNOSIS — E785 Hyperlipidemia, unspecified: Secondary | ICD-10-CM | POA: Diagnosis not present

## 2018-06-20 DIAGNOSIS — R262 Difficulty in walking, not elsewhere classified: Secondary | ICD-10-CM | POA: Diagnosis not present

## 2018-06-20 DIAGNOSIS — E119 Type 2 diabetes mellitus without complications: Secondary | ICD-10-CM | POA: Diagnosis not present

## 2018-06-20 DIAGNOSIS — R531 Weakness: Secondary | ICD-10-CM | POA: Diagnosis not present

## 2018-06-21 DIAGNOSIS — Z510 Encounter for antineoplastic radiation therapy: Secondary | ICD-10-CM | POA: Diagnosis not present

## 2018-06-21 DIAGNOSIS — C494 Malignant neoplasm of connective and soft tissue of abdomen: Secondary | ICD-10-CM | POA: Diagnosis not present

## 2018-06-22 DIAGNOSIS — Z510 Encounter for antineoplastic radiation therapy: Secondary | ICD-10-CM | POA: Diagnosis not present

## 2018-06-22 DIAGNOSIS — C494 Malignant neoplasm of connective and soft tissue of abdomen: Secondary | ICD-10-CM | POA: Diagnosis not present

## 2018-06-23 DIAGNOSIS — N400 Enlarged prostate without lower urinary tract symptoms: Secondary | ICD-10-CM | POA: Diagnosis not present

## 2018-06-23 DIAGNOSIS — Z510 Encounter for antineoplastic radiation therapy: Secondary | ICD-10-CM | POA: Diagnosis not present

## 2018-06-23 DIAGNOSIS — D649 Anemia, unspecified: Secondary | ICD-10-CM | POA: Diagnosis not present

## 2018-06-23 DIAGNOSIS — W19XXXA Unspecified fall, initial encounter: Secondary | ICD-10-CM | POA: Diagnosis not present

## 2018-06-23 DIAGNOSIS — C494 Malignant neoplasm of connective and soft tissue of abdomen: Secondary | ICD-10-CM | POA: Diagnosis not present

## 2018-06-23 DIAGNOSIS — R7989 Other specified abnormal findings of blood chemistry: Secondary | ICD-10-CM | POA: Diagnosis not present

## 2018-06-23 DIAGNOSIS — I10 Essential (primary) hypertension: Secondary | ICD-10-CM | POA: Diagnosis not present

## 2018-06-23 DIAGNOSIS — R5381 Other malaise: Secondary | ICD-10-CM | POA: Diagnosis not present

## 2018-06-23 DIAGNOSIS — Z7401 Bed confinement status: Secondary | ICD-10-CM | POA: Diagnosis not present

## 2018-06-23 DIAGNOSIS — R531 Weakness: Secondary | ICD-10-CM | POA: Diagnosis not present

## 2018-06-23 DIAGNOSIS — E119 Type 2 diabetes mellitus without complications: Secondary | ICD-10-CM | POA: Diagnosis not present

## 2018-06-23 DIAGNOSIS — M2550 Pain in unspecified joint: Secondary | ICD-10-CM | POA: Diagnosis not present

## 2018-06-23 DIAGNOSIS — C480 Malignant neoplasm of retroperitoneum: Secondary | ICD-10-CM | POA: Diagnosis not present

## 2018-06-23 DIAGNOSIS — E876 Hypokalemia: Secondary | ICD-10-CM | POA: Diagnosis not present

## 2018-06-23 DIAGNOSIS — R29898 Other symptoms and signs involving the musculoskeletal system: Secondary | ICD-10-CM | POA: Diagnosis not present

## 2018-06-23 DIAGNOSIS — E785 Hyperlipidemia, unspecified: Secondary | ICD-10-CM | POA: Diagnosis not present

## 2018-06-23 MED ORDER — PANTOPRAZOLE SODIUM 40 MG PO TBEC
40.00 | DELAYED_RELEASE_TABLET | ORAL | Status: DC
Start: 2018-06-24 — End: 2018-06-23

## 2018-06-23 MED ORDER — METOPROLOL TARTRATE 25 MG PO TABS
25.00 | ORAL_TABLET | ORAL | Status: DC
Start: 2018-06-23 — End: 2018-06-23

## 2018-06-23 MED ORDER — INSULIN LISPRO 100 UNIT/ML ~~LOC~~ SOLN
2.00 | SUBCUTANEOUS | Status: DC
Start: 2018-06-23 — End: 2018-06-23

## 2018-06-23 MED ORDER — ATORVASTATIN CALCIUM 10 MG PO TABS
10.00 | ORAL_TABLET | ORAL | Status: DC
Start: 2018-06-23 — End: 2018-06-23

## 2018-06-23 MED ORDER — ASPIRIN EC 81 MG PO TBEC
81.00 | DELAYED_RELEASE_TABLET | ORAL | Status: DC
Start: 2018-06-24 — End: 2018-06-23

## 2018-06-23 MED ORDER — DEXTROSE 10 % IV SOLN
125.00 | INTRAVENOUS | Status: DC
Start: ? — End: 2018-06-23

## 2018-06-23 MED ORDER — GLUCOSE 40 % PO GEL
15.00 | ORAL | Status: DC
Start: ? — End: 2018-06-23

## 2018-06-23 MED ORDER — ENOXAPARIN SODIUM 40 MG/0.4ML ~~LOC~~ SOLN
40.00 | SUBCUTANEOUS | Status: DC
Start: 2018-06-23 — End: 2018-06-23

## 2018-06-23 MED ORDER — TAMSULOSIN HCL 0.4 MG PO CAPS
0.40 | ORAL_CAPSULE | ORAL | Status: DC
Start: 2018-06-24 — End: 2018-06-23

## 2018-06-23 MED ORDER — GENERIC EXTERNAL MEDICATION
1.00 | Status: DC
Start: ? — End: 2018-06-23

## 2018-06-24 DIAGNOSIS — C494 Malignant neoplasm of connective and soft tissue of abdomen: Secondary | ICD-10-CM | POA: Diagnosis not present

## 2018-06-24 DIAGNOSIS — Z510 Encounter for antineoplastic radiation therapy: Secondary | ICD-10-CM | POA: Diagnosis not present

## 2018-06-25 DIAGNOSIS — E119 Type 2 diabetes mellitus without complications: Secondary | ICD-10-CM | POA: Diagnosis not present

## 2018-06-25 DIAGNOSIS — Z510 Encounter for antineoplastic radiation therapy: Secondary | ICD-10-CM | POA: Diagnosis not present

## 2018-06-25 DIAGNOSIS — C480 Malignant neoplasm of retroperitoneum: Secondary | ICD-10-CM | POA: Diagnosis not present

## 2018-06-25 DIAGNOSIS — N400 Enlarged prostate without lower urinary tract symptoms: Secondary | ICD-10-CM | POA: Diagnosis not present

## 2018-06-25 DIAGNOSIS — C494 Malignant neoplasm of connective and soft tissue of abdomen: Secondary | ICD-10-CM | POA: Diagnosis not present

## 2018-06-25 DIAGNOSIS — E785 Hyperlipidemia, unspecified: Secondary | ICD-10-CM | POA: Diagnosis not present

## 2018-07-02 DIAGNOSIS — N400 Enlarged prostate without lower urinary tract symptoms: Secondary | ICD-10-CM | POA: Diagnosis not present

## 2018-07-02 DIAGNOSIS — I10 Essential (primary) hypertension: Secondary | ICD-10-CM | POA: Diagnosis not present

## 2018-07-02 DIAGNOSIS — R5381 Other malaise: Secondary | ICD-10-CM | POA: Diagnosis not present

## 2018-07-12 DIAGNOSIS — C480 Malignant neoplasm of retroperitoneum: Secondary | ICD-10-CM | POA: Diagnosis not present

## 2018-07-12 DIAGNOSIS — N400 Enlarged prostate without lower urinary tract symptoms: Secondary | ICD-10-CM | POA: Diagnosis not present

## 2018-07-12 DIAGNOSIS — I10 Essential (primary) hypertension: Secondary | ICD-10-CM | POA: Diagnosis not present

## 2018-07-12 DIAGNOSIS — D649 Anemia, unspecified: Secondary | ICD-10-CM | POA: Diagnosis not present

## 2018-07-20 DIAGNOSIS — C480 Malignant neoplasm of retroperitoneum: Secondary | ICD-10-CM | POA: Diagnosis not present

## 2018-07-20 DIAGNOSIS — E785 Hyperlipidemia, unspecified: Secondary | ICD-10-CM | POA: Diagnosis not present

## 2018-07-20 DIAGNOSIS — I10 Essential (primary) hypertension: Secondary | ICD-10-CM | POA: Diagnosis not present

## 2018-07-20 DIAGNOSIS — G3184 Mild cognitive impairment, so stated: Secondary | ICD-10-CM | POA: Diagnosis not present

## 2018-07-26 DIAGNOSIS — C4911 Malignant neoplasm of connective and soft tissue of right upper limb, including shoulder: Secondary | ICD-10-CM | POA: Diagnosis not present

## 2018-07-27 ENCOUNTER — Ambulatory Visit: Payer: Medicare Other | Admitting: Internal Medicine

## 2018-07-27 DIAGNOSIS — Z0289 Encounter for other administrative examinations: Secondary | ICD-10-CM

## 2018-09-02 ENCOUNTER — Other Ambulatory Visit: Payer: Self-pay | Admitting: Internal Medicine

## 2018-10-10 DEATH — deceased

## 2018-11-07 IMAGING — MR MR ABDOMEN WO/W CM
13 of 27 series · 20 of 48 positions shown · IV contrast (multihance)
Comparison: No prior abdominal MRI. CT the chest, abdomen and
pelvis 01/28/2018 from [REDACTED].

CLINICAL DATA: 72-year-old male with history of abdominal mass
noted on prior CT examination. Follow-up study.

EXAM:
MRI ABDOMEN WITHOUT AND WITH CONTRAST
TECHNIQUE: Multiplanar multisequence MR imaging of the abdomen was performed
both before and after the administration of intravenous contrast.
CONTRAST:  14mL MULTIHANCE GADOBENATE DIMEGLUMINE 529 MG/ML IV SOLN

[Series 3: T2 · coronal · 7.0mm · 1.37mm/px · 1 of 34 slices shown (1 of 2)]
[im 1/34]
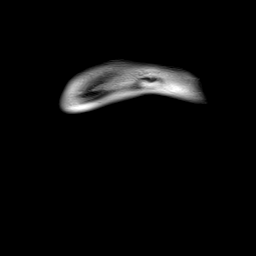

[Series 4: T2 fat-sat · axial · 6.0mm · 1.37mm/px · 1 of 40 slices shown (1 of 2)]
[im 1/40]
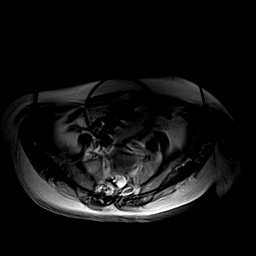

[Series 5: in + out · axial · 6.0mm · 0.70mm/px · z∈[-167,+126]mm · 2 of 80 slices shown (1 of 2)]
[im 1/80]
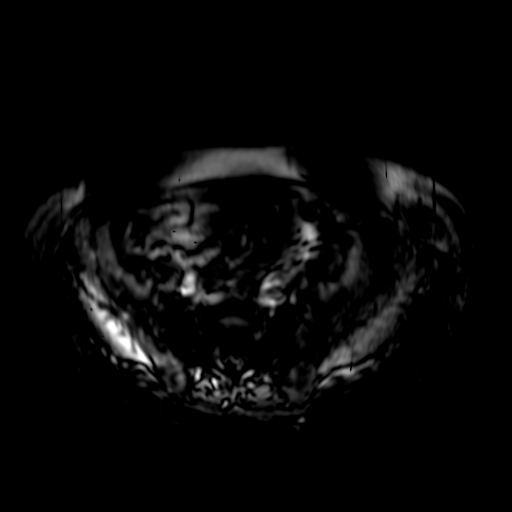
[im 80/80]
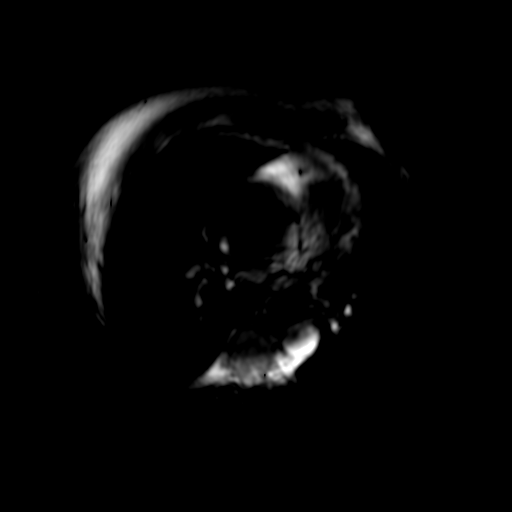

[Series 6: [id number] free breathing · axial · 6.0mm · 1.88mm/px · z∈[-158,+134]mm · 3 of 120 slices shown (1 of 2)]
[im 1/120]
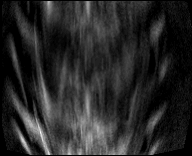
[im 60/120]
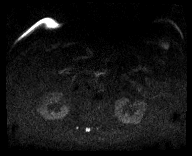
[im 120/120]
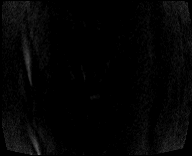

[Series 7: [id number] free breathing_adc · axial · 6.0mm · 1.88mm/px · 1 of 40 slices shown (1 of 2)]
[im 1/40]
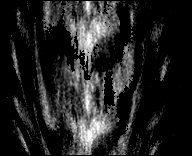

[Series 8: DWI · axial · 6.0mm · 0.70mm/px · 1 of 40 slices shown]
[im 1/40]
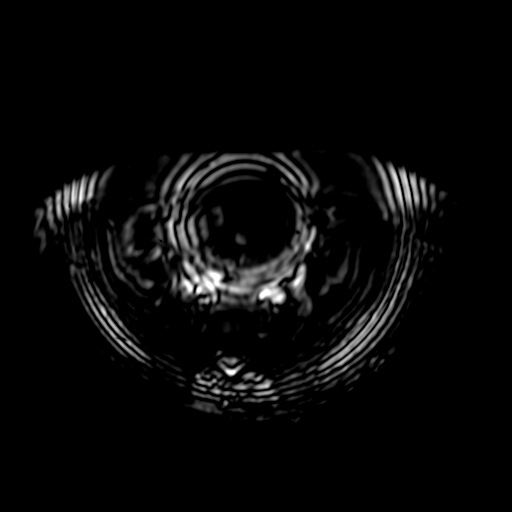

[Series 9: T2 fat-sat · axial · 6.0mm · 1.41mm/px · 1 of 24 slices shown (2 of 2)]
[im 1/24]
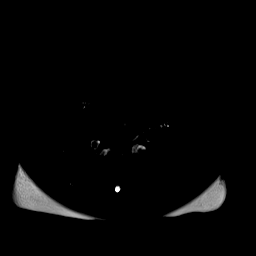

[Series 10: in + out · axial · 6.0mm · 0.70mm/px · z∈[-176,-3]mm · 2 of 48 slices shown (2 of 2)]
[im 1/48]
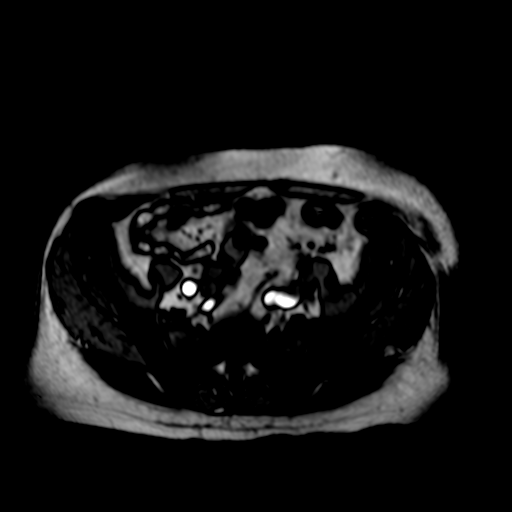
[im 48/48]
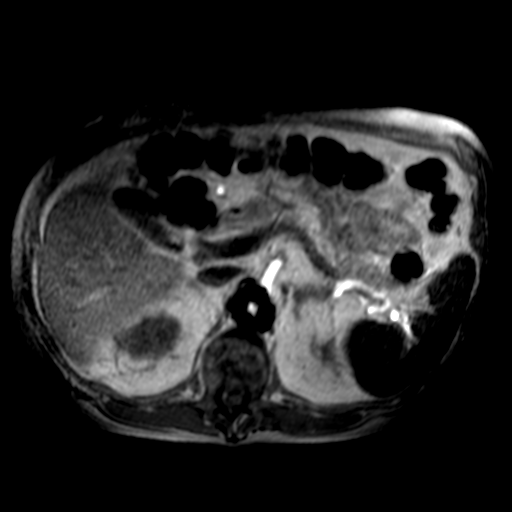

[Series 11: [id number] free breathing · axial · 6.0mm · 1.88mm/px · z∈[-177,-4]mm · 2 of 72 slices shown (2 of 2)]
[im 1/72]
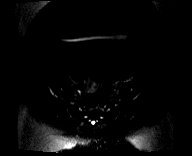
[im 72/72]
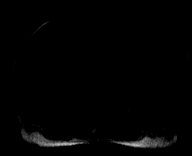

[Series 12: [id number] free breathing_adc · axial · 6.0mm · 1.88mm/px · 1 of 24 slices shown (2 of 2)]
[im 1/24]
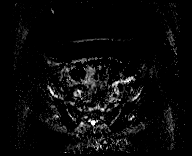

[Series 25: T2 · axial · 6.0mm · 1.41mm/px · 1 of 40 slices shown (2 of 2)]
[im 1/40]
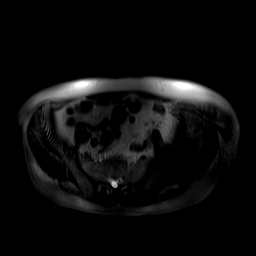

[Series 27: T1 dynamic fat-sat post-contrast · axial · delayed · 5.0mm · 1.25mm/px · z∈[-179,+56]mm · 2 of 48 slices shown (1 of 2)]
[im 1/48]
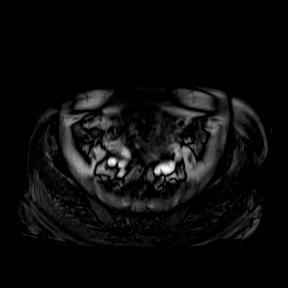
[im 48/48]
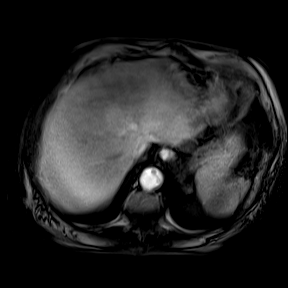

[Series 28: T1 dynamic fat-sat post-contrast · axial · 5.0mm · 1.25mm/px · z∈[-179,+56]mm · 2 of 48 slices shown (2 of 2)]
[im 1/48]
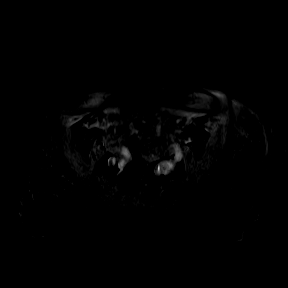
[im 48/48]
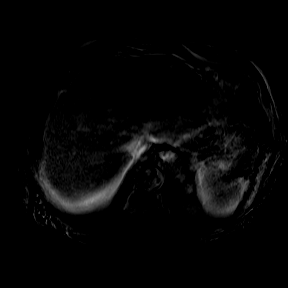

[20 of 48 positions shown; findings below may reference images not displayed]

FINDINGS: Lower chest: Unremarkable.

Hepatobiliary: No suspicious cystic or solid hepatic lesions. No
intra or extrahepatic biliary ductal dilatation. Tiny filling
defects lying dependently in the gallbladder, indicative of small
gallstones. Gallbladder is not distended. No pericholecystic fluid
or surrounding inflammatory changes are noted.

Pancreas: No pancreatic mass. No pancreatic ductal dilatation. No
pancreatic or peripancreatic fluid or inflammatory changes.

Spleen:  Unremarkable.

Adrenals/Urinary Tract: Multiple small renal lesions which are T1
hypointense, T2 hyperintense, and do not enhance, compatible with
simple cysts, largest of which is in the lower pole of the right
kidney measuring 2.3 cm. No suspicious renal lesions. No
hydroureteronephrosis in the visualized portions of the abdomen.
Bilateral adrenal glands are normal in appearance.

Stomach/Bowel: Visualized portions are unremarkable.

Vascular/Lymphatic: Fusiform infrarenal abdominal aortic aneurysm
which measures up to 6.1 x 4.8 cm. Signal void in the middle of the
aneurysm reflective of indwelling aorto bi-iliac stent graft. Large
soft tissue mass adjacent to the infrarenal abdominal aorta on the
left side making broad contact with the aneurysm sac estimated to
measure 11.2 x 8.5 x 10.0 cm (axial image 30 of series 20 and
coronal image 17 of series 26) which demonstrates diffusion
restriction and extensive peripheral predominant enhancement on post
gadolinium images, favored to represent a large nodal mass. Several
small vessels are seen traversing directly through this lesion.

Other:  No significant volume of ascites.  No pneumoperitoneum.

Musculoskeletal: No aggressive appearing osseous lesions are noted
in the visualized portions of the skeleton.
IMPRESSION: 1. Large periaortic soft tissue mass, as detailed above,
corresponding to biopsy-proven poorly differentiated sarcomatoid
malignancy. No definite findings to indicate metastatic disease in
the abdomen.
2. Fusiform aneurysmal dilatation of the infrarenal abdominal aorta
which measures up to 6.1 x 4.8 cm with indwelling stent graft.
3. Additional incidental findings, as above.
